# Patient Record
Sex: Female | Born: 1945 | Race: White | Hispanic: No | Smoking: Former smoker
Health system: Southern US, Community
[De-identification: ages and names within clinical notes are randomized; demographics above are authoritative.]

## PROBLEM LIST (undated history)

## (undated) DIAGNOSIS — R112 Nausea with vomiting, unspecified: Secondary | ICD-10-CM

## (undated) DIAGNOSIS — K760 Fatty (change of) liver, not elsewhere classified: Secondary | ICD-10-CM

## (undated) DIAGNOSIS — Z720 Tobacco use: Secondary | ICD-10-CM

## (undated) DIAGNOSIS — I2510 Atherosclerotic heart disease of native coronary artery without angina pectoris: Secondary | ICD-10-CM

## (undated) DIAGNOSIS — R748 Abnormal levels of other serum enzymes: Secondary | ICD-10-CM

## (undated) DIAGNOSIS — K219 Gastro-esophageal reflux disease without esophagitis: Secondary | ICD-10-CM

## (undated) DIAGNOSIS — M791 Myalgia, unspecified site: Secondary | ICD-10-CM

## (undated) DIAGNOSIS — R7303 Prediabetes: Secondary | ICD-10-CM

## (undated) DIAGNOSIS — Z9889 Other specified postprocedural states: Secondary | ICD-10-CM

## (undated) DIAGNOSIS — E785 Hyperlipidemia, unspecified: Secondary | ICD-10-CM

## (undated) DIAGNOSIS — M8580 Other specified disorders of bone density and structure, unspecified site: Secondary | ICD-10-CM

## (undated) DIAGNOSIS — K5790 Diverticulosis of intestine, part unspecified, without perforation or abscess without bleeding: Secondary | ICD-10-CM

## (undated) DIAGNOSIS — S42302A Unspecified fracture of shaft of humerus, left arm, initial encounter for closed fracture: Secondary | ICD-10-CM

## (undated) DIAGNOSIS — M7070 Other bursitis of hip, unspecified hip: Secondary | ICD-10-CM

## (undated) DIAGNOSIS — M1990 Unspecified osteoarthritis, unspecified site: Secondary | ICD-10-CM

## (undated) HISTORY — DX: Hyperlipidemia, unspecified: E78.5

## (undated) HISTORY — DX: Unspecified fracture of shaft of humerus, left arm, initial encounter for closed fracture: S42.302A

## (undated) HISTORY — PX: COLONOSCOPY W/ POLYPECTOMY: SHX1380

## (undated) HISTORY — PX: BREAST SURGERY: SHX581

## (undated) HISTORY — DX: Other bursitis of hip, unspecified hip: M70.70

## (undated) HISTORY — PX: ESOPHAGOGASTRODUODENOSCOPY (EGD) WITH ESOPHAGEAL DILATION: SHX5812

## (undated) HISTORY — DX: Prediabetes: R73.03

## (undated) HISTORY — DX: Myalgia, unspecified site: M79.10

---

## 1976-05-01 HISTORY — PX: AUGMENTATION MAMMAPLASTY: SUR837

## 1997-10-12 ENCOUNTER — Ambulatory Visit (HOSPITAL_COMMUNITY): Admission: RE | Admit: 1997-10-12 | Discharge: 1997-10-12 | Payer: Self-pay | Admitting: Obstetrics and Gynecology

## 1998-10-15 ENCOUNTER — Ambulatory Visit (HOSPITAL_COMMUNITY): Admission: RE | Admit: 1998-10-15 | Discharge: 1998-10-15 | Payer: Self-pay | Admitting: Obstetrics and Gynecology

## 1998-10-15 ENCOUNTER — Other Ambulatory Visit: Admission: RE | Admit: 1998-10-15 | Discharge: 1998-10-15 | Payer: Self-pay | Admitting: Obstetrics and Gynecology

## 1999-12-05 ENCOUNTER — Encounter: Admission: RE | Admit: 1999-12-05 | Discharge: 1999-12-05 | Payer: Self-pay | Admitting: Obstetrics and Gynecology

## 1999-12-05 ENCOUNTER — Encounter: Payer: Self-pay | Admitting: Obstetrics and Gynecology

## 1999-12-05 ENCOUNTER — Other Ambulatory Visit: Admission: RE | Admit: 1999-12-05 | Discharge: 1999-12-05 | Payer: Self-pay | Admitting: Obstetrics and Gynecology

## 2000-12-07 ENCOUNTER — Encounter: Payer: Self-pay | Admitting: Obstetrics and Gynecology

## 2000-12-07 ENCOUNTER — Ambulatory Visit (HOSPITAL_COMMUNITY): Admission: RE | Admit: 2000-12-07 | Discharge: 2000-12-07 | Payer: Self-pay | Admitting: Obstetrics and Gynecology

## 2000-12-07 ENCOUNTER — Other Ambulatory Visit: Admission: RE | Admit: 2000-12-07 | Discharge: 2000-12-07 | Payer: Self-pay | Admitting: Obstetrics and Gynecology

## 2003-09-25 ENCOUNTER — Ambulatory Visit (HOSPITAL_COMMUNITY): Admission: RE | Admit: 2003-09-25 | Discharge: 2003-09-25 | Payer: Self-pay | Admitting: Obstetrics and Gynecology

## 2003-09-25 ENCOUNTER — Other Ambulatory Visit: Admission: RE | Admit: 2003-09-25 | Discharge: 2003-09-25 | Payer: Self-pay | Admitting: Obstetrics and Gynecology

## 2004-08-16 ENCOUNTER — Ambulatory Visit: Payer: Self-pay | Admitting: Gastroenterology

## 2005-12-28 ENCOUNTER — Ambulatory Visit (HOSPITAL_COMMUNITY): Admission: RE | Admit: 2005-12-28 | Discharge: 2005-12-28 | Payer: Self-pay | Admitting: Internal Medicine

## 2007-06-12 ENCOUNTER — Ambulatory Visit (HOSPITAL_COMMUNITY): Admission: RE | Admit: 2007-06-12 | Discharge: 2007-06-12 | Payer: Self-pay | Admitting: Internal Medicine

## 2007-06-26 ENCOUNTER — Encounter: Admission: RE | Admit: 2007-06-26 | Discharge: 2007-06-26 | Payer: Self-pay | Admitting: Internal Medicine

## 2008-05-01 HISTORY — PX: UPPER GASTROINTESTINAL ENDOSCOPY: SHX188

## 2009-03-16 ENCOUNTER — Ambulatory Visit: Payer: Self-pay | Admitting: Unknown Physician Specialty

## 2010-05-22 ENCOUNTER — Encounter: Payer: Self-pay | Admitting: Internal Medicine

## 2010-05-27 ENCOUNTER — Ambulatory Visit: Payer: Self-pay | Admitting: Unknown Physician Specialty

## 2010-06-21 ENCOUNTER — Other Ambulatory Visit (HOSPITAL_COMMUNITY): Payer: Self-pay | Admitting: Internal Medicine

## 2010-06-21 DIAGNOSIS — Z1231 Encounter for screening mammogram for malignant neoplasm of breast: Secondary | ICD-10-CM

## 2010-07-18 ENCOUNTER — Ambulatory Visit (HOSPITAL_COMMUNITY): Payer: Self-pay

## 2010-07-18 ENCOUNTER — Ambulatory Visit: Payer: Self-pay | Admitting: Rheumatology

## 2010-07-20 ENCOUNTER — Ambulatory Visit (HOSPITAL_COMMUNITY)
Admission: RE | Admit: 2010-07-20 | Discharge: 2010-07-20 | Disposition: A | Discharge status: Home / Self Care | Payer: Medicare Other | Source: Ambulatory Visit | Attending: Internal Medicine | Admitting: Internal Medicine

## 2010-07-20 ENCOUNTER — Ambulatory Visit (HOSPITAL_COMMUNITY): Admission: RE | Admit: 2010-07-20 | Payer: Medicare Other | Source: Ambulatory Visit

## 2010-07-20 DIAGNOSIS — Z1231 Encounter for screening mammogram for malignant neoplasm of breast: Secondary | ICD-10-CM | POA: Insufficient documentation

## 2011-01-05 ENCOUNTER — Ambulatory Visit (INDEPENDENT_AMBULATORY_CARE_PROVIDER_SITE_OTHER): Payer: Medicare Other | Admitting: Internal Medicine

## 2011-01-05 ENCOUNTER — Encounter: Payer: Self-pay | Admitting: Internal Medicine

## 2011-01-05 VITALS — BP 127/79 | HR 84 | Temp 98.5°F | Resp 16 | Ht 65.0 in | Wt 182.0 lb

## 2011-01-05 DIAGNOSIS — T8549XA Other mechanical complication of breast prosthesis and implant, initial encounter: Secondary | ICD-10-CM

## 2011-01-05 DIAGNOSIS — E785 Hyperlipidemia, unspecified: Secondary | ICD-10-CM

## 2011-01-05 DIAGNOSIS — M79605 Pain in left leg: Secondary | ICD-10-CM

## 2011-01-05 DIAGNOSIS — M79609 Pain in unspecified limb: Secondary | ICD-10-CM

## 2011-01-05 DIAGNOSIS — T8543XA Leakage of breast prosthesis and implant, initial encounter: Secondary | ICD-10-CM

## 2011-01-05 NOTE — Progress Notes (Signed)
Subjective:    Patient ID: Taylor Simmons, female    DOB: 18-Sep-1945, 65 y.o.   MRN: [id number]  HPI Taylor Simmons is a 66 year old female who presents to establish care. Her only concern today is a history of rupture of her bilateral silicone breast implants. She reports that her last mammogram showed stable rupture of both of her implants. She questions whether she needs to have these implants removed. She denies any pain or discomfort in her chest. She was told in the past that she may need an MRI of her breast for further evaluation. Her silicone implants were placed in 1970, and the surgeon who performed this procedure is not available.  Taylor Simmons also notes a history of bilateral leg pain over her anterior thighs. She reports that this has been worked up extensively including by Dr. Marchia Bond at the pain clinic and Dr. Kathi Ludwig at rheumatology. She is currently taking Neurontin 100 mg at night with good control of her pain.  Outpatient Encounter Prescriptions as of 01/05/2011  Medication Sig Dispense Refill  . aspirin 81 MG tablet Take 81 mg by mouth daily.        . Methylcellulose, Laxative, (CITRUCEL PO) Take 4 capsules by mouth daily.        . Omega-3 Fatty Acids (FISH OIL) 1000 MG CAPS Take 1 capsule by mouth 2 (two) times daily at 10 AM and 5 PM.        . AMITIZA 24 MCG capsule Take 24 mcg by mouth 2 (two) times daily.       . clonazePAM (KLONOPIN) 1 MG tablet Take 1 mg by mouth at bedtime as needed.       . CRESTOR 20 MG tablet Take 20 mg by mouth daily.       Marland Kitchen DEXILANT 60 MG capsule Take 1 capsule by mouth daily.       . fluticasone (FLONASE) 50 MCG/ACT nasal spray Place 2 sprays into the nose daily.       Marland Kitchen gabapentin (NEURONTIN) 100 MG capsule Take 100 mg by mouth 3 (three) times daily.         Review of Systems  Constitutional: Negative for fever, chills, appetite change, fatigue and unexpected weight change.  HENT: Negative for ear pain, congestion, sore throat, trouble  swallowing, neck pain, voice change and sinus pressure.   Eyes: Negative for visual disturbance.  Respiratory: Negative for cough, shortness of breath, wheezing and stridor.   Cardiovascular: Negative for chest pain, palpitations and leg swelling.  Gastrointestinal: Negative for nausea, vomiting, abdominal pain, diarrhea, constipation, blood in stool, abdominal distention and anal bleeding.  Genitourinary: Negative for dysuria and flank pain.  Musculoskeletal: Positive for myalgias and arthralgias. Negative for gait problem.  Skin: Negative for color change and rash.  Neurological: Negative for dizziness and headaches.  Hematological: Negative for adenopathy. Does not bruise/bleed easily.  Psychiatric/Behavioral: Negative for suicidal ideas, sleep disturbance and dysphoric mood. The patient is not nervous/anxious.    BP 127/79  Pulse 84  Temp(Src) 98.5 F (36.9 C) (Oral)  Resp 16  Ht 5\' 5"  (1.651 m)  Wt 182 lb (82.555 kg)  BMI 30.29 kg/m2  SpO2 97%     Objective:   Physical Exam  Constitutional: She is oriented to person, place, and time. She appears well-developed and well-nourished. No distress.  HENT:  Head: Normocephalic and atraumatic.  Right Ear: External ear normal.  Left Ear: External ear normal.  Nose: Nose normal.  Mouth/Throat: Oropharynx is clear and  moist. No oropharyngeal exudate.  Eyes: Conjunctivae are normal. Pupils are equal, round, and reactive to light. Right eye exhibits no discharge. Left eye exhibits no discharge. No scleral icterus.  Neck: Normal range of motion. Neck supple. No tracheal deviation present. No thyromegaly present.  Cardiovascular: Normal rate, regular rhythm, normal heart sounds and intact distal pulses.  Exam reveals no gallop and no friction rub.   No murmur heard. Pulmonary/Chest: Effort normal and breath sounds normal. No respiratory distress. She has no wheezes. She has no rales. She exhibits no tenderness.  Abdominal: Soft. Bowel  sounds are normal. She exhibits no distension and no mass. There is no tenderness. There is no rebound and no guarding.  Musculoskeletal: Normal range of motion. She exhibits no edema and no tenderness.  Lymphadenopathy:    She has no cervical adenopathy.  Neurological: She is alert and oriented to person, place, and time. No cranial nerve deficit. She exhibits normal muscle tone. Coordination normal.  Skin: Skin is warm and dry. No rash noted. She is not diaphoretic. No erythema. No pallor.  Psychiatric: She has a normal mood and affect. Her behavior is normal. Judgment and thought content normal.          Assessment & Plan:  1. Breast implant rupture -  Patient with a history of silicone implants placed in the 1970s. Recent mammogram in 2012 showed stable bilateral rupture of these implants. Patient questions whether he should be removed. We will set her up with plastic surgery to get their opinion.  2. Leg pain - patient with chronic bilateral thigh pain. She reports extensive workup as to the cause of this and we will request previous records. She will continue Neurontin at night as this is working well for her.  3. Hyperlipidemia - patient with a history of hyperlipidemia currently taking Crestor. We will request recent records including lipid profile and LFTs.

## 2011-02-13 ENCOUNTER — Encounter: Payer: Self-pay | Admitting: Internal Medicine

## 2011-02-13 ENCOUNTER — Ambulatory Visit (INDEPENDENT_AMBULATORY_CARE_PROVIDER_SITE_OTHER): Payer: Medicare Other | Admitting: Internal Medicine

## 2011-02-13 VITALS — BP 134/78 | HR 65 | Temp 98.0°F | Resp 16 | Ht 65.0 in | Wt 181.2 lb

## 2011-02-13 DIAGNOSIS — Z23 Encounter for immunization: Secondary | ICD-10-CM

## 2011-02-13 DIAGNOSIS — E039 Hypothyroidism, unspecified: Secondary | ICD-10-CM

## 2011-02-13 DIAGNOSIS — E785 Hyperlipidemia, unspecified: Secondary | ICD-10-CM

## 2011-02-13 DIAGNOSIS — Z01818 Encounter for other preprocedural examination: Secondary | ICD-10-CM

## 2011-02-13 DIAGNOSIS — K5900 Constipation, unspecified: Secondary | ICD-10-CM | POA: Insufficient documentation

## 2011-02-13 LAB — CBC WITH DIFFERENTIAL/PLATELET
Basophils Absolute: 0 10*3/uL (ref 0.0–0.1)
Eosinophils Absolute: 0.1 10*3/uL (ref 0.0–0.7)
Eosinophils Relative: 1.2 % (ref 0.0–5.0)
HCT: 39.2 % (ref 36.0–46.0)
Lymphs Abs: 2.4 10*3/uL (ref 0.7–4.0)
MCHC: 33.1 g/dL (ref 30.0–36.0)
MCV: 97.4 fl (ref 78.0–100.0)
Monocytes Absolute: 0.5 10*3/uL (ref 0.1–1.0)
Neutrophils Relative %: 52.1 % (ref 43.0–77.0)
Platelets: 223 10*3/uL (ref 150.0–400.0)
RDW: 12.6 % (ref 11.5–14.6)

## 2011-02-13 LAB — COMPREHENSIVE METABOLIC PANEL
AST: 25 U/L (ref 0–37)
Alkaline Phosphatase: 62 U/L (ref 39–117)
Glucose, Bld: 109 mg/dL — ABNORMAL HIGH (ref 70–99)
Sodium: 143 mEq/L (ref 135–145)
Total Bilirubin: 0.7 mg/dL (ref 0.3–1.2)
Total Protein: 7 g/dL (ref 6.0–8.3)

## 2011-02-13 LAB — TSH: TSH: 1.04 u[IU]/mL (ref 0.35–5.50)

## 2011-02-13 NOTE — Progress Notes (Signed)
Subjective:    Patient ID: Taylor Simmons, female    DOB: 1945-11-01, 65 y.o.   MRN: [id number]  HPI Taylor Simmons is a 65 year old female who presents for followup. At her last visit she reported that her breast implants for leaking. We set her up for evaluation with plastic surgery at [facility]. She reports that she discussed removing her implants with her plastic surgeon. She is still not absolutely sure that she will have this procedure. She is debating both having the procedure and whether or not to replace the implants with saline versus silicone implants. She notes that she will need a preoperative evaluation prior to surgery. She was told that she will need to have lab work and EKG done prior to surgery.  Taylor Simmons also reports persistent constipation. She notes that she tried Amtiza as prescribed by Dr. Mechele Collin with minimal improvement. She notes that this constipation has been persistent since she was a teenager. She has tried several medications with no improvement. She has tried increasing her consumption of fiber and water without improvement. GI workup has been unrevealing to date. She notes that chronic constipation leads to abdominal distention and she feels that this limits her ability to lose weight.  Outpatient Encounter Prescriptions as of 02/13/2011  Medication Sig Dispense Refill  . AMITIZA 24 MCG capsule Take 24 mcg by mouth 2 (two) times daily.       . clonazePAM (KLONOPIN) 1 MG tablet Take 1 mg by mouth at bedtime as needed.       . CRESTOR 20 MG tablet Take 20 mg by mouth daily.       Marland Kitchen DEXILANT 60 MG capsule Take 1 capsule by mouth daily.       . fluticasone (FLONASE) 50 MCG/ACT nasal spray Place 2 sprays into the nose daily.       Marland Kitchen gabapentin (NEURONTIN) 100 MG capsule Take 100 mg by mouth 3 (three) times daily.         Review of Systems  Constitutional: Negative for fever, chills, diaphoresis and fatigue.  Gastrointestinal: Positive for constipation and abdominal  distention. Negative for nausea, abdominal pain, diarrhea, blood in stool and anal bleeding.  Musculoskeletal: Positive for myalgias and arthralgias.   BP 134/78  Pulse 65  Temp(Src) 98 F (36.7 C) (Oral)  Resp 16  Ht 5\' 5"  (1.651 m)  Wt 181 lb 4 oz (82.214 kg)  BMI 30.16 kg/m2  SpO2 95%     Objective:   Physical Exam  Constitutional: She is oriented to person, place, and time. She appears well-developed and well-nourished. No distress.  HENT:  Head: Normocephalic and atraumatic.  Right Ear: External ear normal.  Left Ear: External ear normal.  Nose: Nose normal.  Mouth/Throat: Oropharynx is clear and moist. No oropharyngeal exudate.  Eyes: Conjunctivae are normal. Pupils are equal, round, and reactive to light. Right eye exhibits no discharge. Left eye exhibits no discharge. No scleral icterus.  Neck: Normal range of motion. Neck supple. No tracheal deviation present. No thyromegaly present.  Cardiovascular: Normal rate, regular rhythm, normal heart sounds and intact distal pulses.  Exam reveals no gallop and no friction rub.   No murmur heard. Pulmonary/Chest: Effort normal and breath sounds normal. No respiratory distress. She has no wheezes. She has no rales. She exhibits no tenderness.  Musculoskeletal: Normal range of motion. She exhibits no edema and no tenderness.  Lymphadenopathy:    She has no cervical adenopathy.  Neurological: She is alert and oriented to person,  place, and time. No cranial nerve deficit. She exhibits normal muscle tone. Coordination normal.  Skin: Skin is warm and dry. No rash noted. She is not diaphoretic. No erythema. No pallor.  Psychiatric: She has a normal mood and affect. Her behavior is normal. Judgment and thought content normal.          Assessment & Plan:  1. Breast Implant Leakage / preoperative eval for implant removal -we discussed the pros and cons of implant removal today. Given that the risk of silicone leakage weighs on her mind  so heavily I suspect that she would benefit from removal of her implants. It seems that she would also benefit from replacement with saline implants given that the question of toxicity from silicone has been worrisome to her. However, she is likely to have a significant recovery period given that it has been so long since the implants were first placed and she likely has extensive scar tissue. I encouraged her to seek the advice of her surgeon, who will be able to better estimate her recovery time and provide guidance as to which type of implant to use. She will followup with her plastic surgeon. Based on the modified risk index she would be a low risk for perioperative cardiac events. We will perform EKG and lab work today as requested by her surgeon.  2. Constipation -  Constipation has been long-standing. She reports extensive GI workup which has been negative to date. We will request these reports. I encouraged her to continue with increased fluid intake and to continue using amtiza daily. TSH with labs today to ensure that hypothyroidism not leading to constipation.

## 2011-03-27 ENCOUNTER — Other Ambulatory Visit: Payer: Self-pay | Admitting: Internal Medicine

## 2011-03-28 ENCOUNTER — Telehealth: Payer: Self-pay | Admitting: *Deleted

## 2011-03-28 ENCOUNTER — Other Ambulatory Visit: Payer: Self-pay | Admitting: Internal Medicine

## 2011-03-28 NOTE — Telephone Encounter (Signed)
OK to refill one month supply

## 2011-03-28 NOTE — Telephone Encounter (Signed)
Pharm faxed RF request -  Klonopin 1 mg 1 qhs PRN. OK for RF?

## 2011-03-29 MED ORDER — CLONAZEPAM 1 MG PO TABS: 1.0000 mg | ORAL_TABLET | Freq: Every evening | ORAL | Status: DC | PRN

## 2011-03-29 NOTE — Telephone Encounter (Signed)
Called in.

## 2011-05-17 ENCOUNTER — Telehealth: Payer: Self-pay | Admitting: *Deleted

## 2011-05-17 ENCOUNTER — Encounter: Payer: Self-pay | Admitting: Internal Medicine

## 2011-05-17 ENCOUNTER — Ambulatory Visit (INDEPENDENT_AMBULATORY_CARE_PROVIDER_SITE_OTHER): Payer: Medicare Other | Admitting: Internal Medicine

## 2011-05-17 DIAGNOSIS — E785 Hyperlipidemia, unspecified: Secondary | ICD-10-CM

## 2011-05-17 DIAGNOSIS — F411 Generalized anxiety disorder: Secondary | ICD-10-CM

## 2011-05-17 DIAGNOSIS — F419 Anxiety disorder, unspecified: Secondary | ICD-10-CM

## 2011-05-17 DIAGNOSIS — K5900 Constipation, unspecified: Secondary | ICD-10-CM

## 2011-05-17 MED ORDER — LACTULOSE 20 GM/30ML PO SOLN: 30.0000 mL | ORAL | Status: DC | PRN

## 2011-05-17 MED ORDER — CLONAZEPAM 1 MG PO TABS: 1.0000 mg | ORAL_TABLET | Freq: Every evening | ORAL | Status: DC | PRN

## 2011-05-17 MED ORDER — ROSUVASTATIN CALCIUM 20 MG PO TABS: 20.0000 mg | ORAL_TABLET | Freq: Every day | ORAL | Status: DC

## 2011-05-17 NOTE — Progress Notes (Signed)
Subjective:    Patient ID: Taylor Simmons, female    DOB: 1945/10/05, 66 y.o.   MRN: [id number]  HPI Taylor Simmons is a 66 year old female who presents for followup.She notes that she has not yet made a decision about whether or not to remove her breast implants. She is debating both having the procedure and whether or not to replace the implants with saline versus silicone implants. She is planning to return to her plastic surgeon for additional discussion prior to making a decision.  Taylor Simmons also reports persistent constipation. She notes that she tried Amtiza as prescribed by Dr. Mechele Collin with minimal improvement. She notes that this constipation has been persistent since she was a teenager. She has tried several medications with no improvement. She has tried increasing her consumption of fiber and water without improvement. GI workup has been unrevealing to date. She notes that chronic constipation leads to abdominal distention and she feels that this limits her ability to lose weight. She is interested in trying another medication.   Review of Systems  Constitutional: Negative for fever, chills, appetite change, fatigue and unexpected weight change.  HENT: Negative for ear pain, congestion, sore throat, trouble swallowing, neck pain, voice change and sinus pressure.   Eyes: Negative for visual disturbance.  Respiratory: Negative for cough, shortness of breath, wheezing and stridor.   Cardiovascular: Negative for chest pain, palpitations and leg swelling.  Gastrointestinal: Positive for constipation and abdominal distention. Negative for nausea, vomiting, abdominal pain, diarrhea, blood in stool and anal bleeding.  Genitourinary: Negative for dysuria and flank pain.  Musculoskeletal: Negative for myalgias, arthralgias and gait problem.  Skin: Negative for color change and rash.  Neurological: Negative for dizziness and headaches.  Hematological: Negative for adenopathy. Does not  bruise/bleed easily.  Psychiatric/Behavioral: Negative for suicidal ideas, sleep disturbance and dysphoric mood. The patient is not nervous/anxious.    BP 158/68  Pulse 54  Temp(Src) 98.7 F (37.1 C) (Oral)  Ht 5\' 5"  (1.651 m)  Wt 183 lb (83.008 kg)  BMI 30.45 kg/m2  SpO2 98%     Objective:   Physical Exam  Constitutional: She is oriented to person, place, and time. She appears well-developed and well-nourished. No distress.  HENT:  Head: Normocephalic and atraumatic.  Right Ear: External ear normal.  Left Ear: External ear normal.  Nose: Nose normal.  Mouth/Throat: Oropharynx is clear and moist. No oropharyngeal exudate.  Eyes: Conjunctivae are normal. Pupils are equal, round, and reactive to light. Right eye exhibits no discharge. Left eye exhibits no discharge. No scleral icterus.  Neck: Normal range of motion. Neck supple. No tracheal deviation present. No thyromegaly present.  Cardiovascular: Normal rate, regular rhythm, normal heart sounds and intact distal pulses.  Exam reveals no gallop and no friction rub.   No murmur heard. Pulmonary/Chest: Effort normal and breath sounds normal. No respiratory distress. She has no wheezes. She has no rales. She exhibits no tenderness.  Abdominal: Soft. She exhibits distension. There is no tenderness.  Musculoskeletal: Normal range of motion. She exhibits no edema and no tenderness.  Lymphadenopathy:    She has no cervical adenopathy.  Neurological: She is alert and oriented to person, place, and time. No cranial nerve deficit. She exhibits normal muscle tone. Coordination normal.  Skin: Skin is warm and dry. No rash noted. She is not diaphoretic. No erythema. No pallor.  Psychiatric: She has a normal mood and affect. Her behavior is normal. Judgment and thought content normal.  Assessment & Plan:  1. Breast Implant Leakage / preoperative eval for implant removal -we again discussed the pros and cons of implant removal  today. She will followup with her plastic surgeon prior to making final decision.  2. Constipation -  Constipation has been long-standing. She reports extensive GI workup which has been negative to date.  I encouraged her to continue with increased fluid intake and to continue using amtiza daily. Will add lactulose to see if any improvement. I have also given pt samples of Align to try to see if any improvement. Follow up 6 months.

## 2011-05-17 NOTE — Telephone Encounter (Signed)
Called in klonopin

## 2011-07-14 ENCOUNTER — Encounter: Payer: Self-pay | Admitting: Internal Medicine

## 2011-07-14 ENCOUNTER — Ambulatory Visit (INDEPENDENT_AMBULATORY_CARE_PROVIDER_SITE_OTHER)
Admission: RE | Admit: 2011-07-14 | Discharge: 2011-07-14 | Disposition: A | Discharge status: Home / Self Care | Payer: Medicare Other | Source: Ambulatory Visit | Attending: Internal Medicine | Admitting: Internal Medicine

## 2011-07-14 ENCOUNTER — Ambulatory Visit (INDEPENDENT_AMBULATORY_CARE_PROVIDER_SITE_OTHER): Payer: Medicare Other | Admitting: Internal Medicine

## 2011-07-14 DIAGNOSIS — K5900 Constipation, unspecified: Secondary | ICD-10-CM

## 2011-07-14 DIAGNOSIS — R143 Flatulence: Secondary | ICD-10-CM

## 2011-07-14 DIAGNOSIS — R079 Chest pain, unspecified: Secondary | ICD-10-CM | POA: Insufficient documentation

## 2011-07-14 DIAGNOSIS — E785 Hyperlipidemia, unspecified: Secondary | ICD-10-CM

## 2011-07-14 DIAGNOSIS — R109 Unspecified abdominal pain: Secondary | ICD-10-CM

## 2011-07-14 DIAGNOSIS — R140 Abdominal distension (gaseous): Secondary | ICD-10-CM

## 2011-07-14 DIAGNOSIS — E039 Hypothyroidism, unspecified: Secondary | ICD-10-CM

## 2011-07-14 DIAGNOSIS — D510 Vitamin B12 deficiency anemia due to intrinsic factor deficiency: Secondary | ICD-10-CM

## 2011-07-14 DIAGNOSIS — J019 Acute sinusitis, unspecified: Secondary | ICD-10-CM | POA: Insufficient documentation

## 2011-07-14 LAB — COMPREHENSIVE METABOLIC PANEL
ALT: 46 U/L — ABNORMAL HIGH (ref 0–35)
Albumin: 4.3 g/dL (ref 3.5–5.2)
CO2: 28 mEq/L (ref 19–32)
Calcium: 9.2 mg/dL (ref 8.4–10.5)
Chloride: 104 mEq/L (ref 96–112)
GFR: 64.06 mL/min (ref >60.00)
Glucose, Bld: 89 mg/dL (ref 70–99)
Potassium: 4.1 mEq/L (ref 3.5–5.1)
Sodium: 141 mEq/L (ref 135–145)
Total Bilirubin: 0.4 mg/dL (ref 0.3–1.2)
Total Protein: 7.2 g/dL (ref 6.0–8.3)

## 2011-07-14 LAB — CBC WITH DIFFERENTIAL/PLATELET
Basophils Absolute: 0 10*3/uL (ref 0.0–0.1)
Eosinophils Absolute: 0.2 10*3/uL (ref 0.0–0.7)
Hemoglobin: 12.4 g/dL (ref 12.0–15.0)
Lymphocytes Relative: 33.3 % (ref 12.0–46.0)
MCHC: 33.6 g/dL (ref 30.0–36.0)
Monocytes Relative: 11.1 % (ref 3.0–12.0)
Neutro Abs: 3.5 10*3/uL (ref 1.4–7.7)
Platelets: 226 10*3/uL (ref 150.0–400.0)
RDW: 13.3 % (ref 11.5–14.6)

## 2011-07-14 LAB — VITAMIN B12: Vitamin B-12: 452 pg/mL (ref 211–911)

## 2011-07-14 LAB — TSH: TSH: 1.01 u[IU]/mL (ref 0.35–5.50)

## 2011-07-14 MED ORDER — AMOXICILLIN-POT CLAVULANATE 875-125 MG PO TABS: 1.0000 | ORAL_TABLET | Freq: Two times a day (BID) | ORAL | Status: AC

## 2011-07-14 NOTE — Assessment & Plan Note (Signed)
Unclear etiology. Symptoms are not consistent with myocardial ischemia and EKG is normal. Pulmonary exam is normal, however patient is former smoker and has some risk of malignancy. Will get chest x-ray for further evaluation. His chest x-ray is normal and symptoms persist, may need to get CT of the chest for further evaluation.

## 2011-07-14 NOTE — Assessment & Plan Note (Signed)
As above, will get CT of the abdomen for further evaluation.

## 2011-07-14 NOTE — Assessment & Plan Note (Signed)
Patient is very concerned about abdominal bloating and distention with mild pain. Exam is most consistent with fatty tissue over the superficial abdomen. However, given that this is a change from her baseline and she reports significant distention compared to previous, will get CT for further evaluation. Alternative considerations would include ovarian pathology. Follow up 1 week.

## 2011-07-14 NOTE — Assessment & Plan Note (Signed)
Symptoms consistent with sinusitis. Will treat with Augmentin. Patient will call if symptoms are not improving over the next 72 hours.

## 2011-07-14 NOTE — Progress Notes (Signed)
Subjective:    Patient ID: Taylor Simmons, female    DOB: 05-22-1945, 66 y.o.   MRN: [id number]  HPI 66 year old female presents for acute visit complaining of feeling "jittery "and having left-sided intermittent sharp chest pain over the last several weeks. These symptoms do not occur at exertion. They're intermittent and occur at rest. He resolve within seconds without any intervention. She denies any persistent chest pain or heaviness, diaphoresis, nausea, or shortness of breath.  She is also concerned today about a three-week history of nasal congestion. She reports that the drainage from her nose has purulent. She also reports occasional cough which is nonproductive. She denies shortness of breath, fever, chills. She has been using over-the-counter cough and cold preparations with no improvement.  She is also concerned today about several month history of gradually progressive abdominal distention. She notes that she has been exercising vigorously and watching her diet an effort to improve her abdominal girth however her abdomen continues to increase in size. She has been reading on the Internet and is concerned about the potential of fibroid tumor. She reports mild pain with the distention of her abdomen. She notes that she has never had a hysterectomy and ovaries are still present. She denies any vaginal discharge or bleeding. She denies any change in her bowel habits, blood in her stool, or black stool. She denies nausea or vomiting.  Outpatient Encounter Prescriptions as of 07/14/2011  Medication Sig Dispense Refill  . AMITIZA 24 MCG capsule Take 24 mcg by mouth 2 (two) times daily.       . clonazePAM (KLONOPIN) 1 MG tablet Take 1 tablet (1 mg total) by mouth at bedtime as needed.  30 tablet  3  . DEXILANT 60 MG capsule Take 1 capsule by mouth daily.       . fluticasone (FLONASE) 50 MCG/ACT nasal spray USE 2 SPRAYS EACH NOSTRIL ONCE A DAY  16 g  6  . Lactulose 20 GM/30ML SOLN Take 30 mLs  (20 g total) by mouth as needed (Take 30ml every 2hr until BM, up to 3 doses twice weekly).  500 mL  6  . rosuvastatin (CRESTOR) 20 MG tablet Take 1 tablet (20 mg total) by mouth daily.  30 tablet  6  . amoxicillin-clavulanate (AUGMENTIN) 875-125 MG per tablet Take 1 tablet by mouth 2 (two) times daily.  20 tablet  0  . gabapentin (NEURONTIN) 100 MG capsule         Review of Systems  Constitutional: Negative for fever, chills, appetite change, fatigue and unexpected weight change.  HENT: Positive for congestion and sinus pressure. Negative for ear pain, sore throat, trouble swallowing, neck pain and voice change.   Eyes: Negative for visual disturbance.  Respiratory: Negative for cough, shortness of breath, wheezing and stridor.   Cardiovascular: Positive for chest pain. Negative for palpitations and leg swelling.  Gastrointestinal: Positive for abdominal pain and abdominal distention. Negative for nausea, vomiting, diarrhea, constipation, blood in stool and anal bleeding.  Genitourinary: Negative for dysuria and flank pain.  Musculoskeletal: Negative for myalgias, arthralgias and gait problem.  Skin: Negative for color change and rash.  Neurological: Negative for dizziness and headaches.  Hematological: Negative for adenopathy. Does not bruise/bleed easily.  Psychiatric/Behavioral: Negative for suicidal ideas, sleep disturbance and dysphoric mood. The patient is not nervous/anxious.    BP 138/68  Pulse 90  Temp(Src) 98.4 F (36.9 C) (Oral)  Ht 5\' 5"  (1.651 m)  Wt 189 lb (85.73 kg)  BMI 31.45  kg/m2  SpO2 97%     Objective:   Physical Exam  Constitutional: She is oriented to person, place, and time. She appears well-developed and well-nourished. No distress.  HENT:  Head: Normocephalic and atraumatic.  Right Ear: External ear normal.  Left Ear: External ear normal.  Nose: Nose normal.  Mouth/Throat: Oropharynx is clear and moist. No oropharyngeal exudate.  Eyes: Conjunctivae are  normal. Pupils are equal, round, and reactive to light. Right eye exhibits no discharge. Left eye exhibits no discharge. No scleral icterus.  Neck: Normal range of motion. Neck supple. No tracheal deviation present. No thyromegaly present.  Cardiovascular: Normal rate, regular rhythm, normal heart sounds and intact distal pulses.  Exam reveals no gallop and no friction rub.   No murmur heard. Pulmonary/Chest: Effort normal and breath sounds normal. No respiratory distress. She has no wheezes. She has no rales. She exhibits no tenderness.  Abdominal: Soft. Bowel sounds are normal. She exhibits distension (mild). She exhibits no mass. There is no tenderness. There is no rebound and no guarding.  Musculoskeletal: Normal range of motion. She exhibits no edema and no tenderness.  Lymphadenopathy:    She has no cervical adenopathy.  Neurological: She is alert and oriented to person, place, and time. No cranial nerve deficit. She exhibits normal muscle tone. Coordination normal.  Skin: Skin is warm and dry. No rash noted. She is not diaphoretic. No erythema. No pallor.  Psychiatric: She has a normal mood and affect. Her behavior is normal. Judgment and thought content normal.          Assessment & Plan:

## 2011-07-20 ENCOUNTER — Ambulatory Visit: Payer: Self-pay | Admitting: Internal Medicine

## 2011-07-21 ENCOUNTER — Telehealth: Payer: Self-pay | Admitting: Internal Medicine

## 2011-07-21 NOTE — Telephone Encounter (Signed)
Patient notified. She says that she sees Dr. Markham Jordan, but it has been over a year ago.  She is asking that the appt not be scheduled between April 17th to the 20th.

## 2011-07-21 NOTE — Telephone Encounter (Signed)
CT abdomen showed diverticulosis, which is a chronic finding.  The radiologist noted "fullness" in the small bowel, likely from how the bowel was moving at the time of the study.  Has the patient seen GI physician recently? Given her symptoms of bloating, I would like to schedule her with her GI physician to have them review the study and perhaps perform upper endoscopy.

## 2011-07-21 NOTE — Telephone Encounter (Signed)
Patient is scheduled to see Fransico Setters at [facility] who is the PA to dr. Markham Jordan appointment is for 08/03/11 at 8:30.  Patient is aware of this appointment.

## 2011-07-24 ENCOUNTER — Other Ambulatory Visit: Payer: Self-pay | Admitting: Internal Medicine

## 2011-07-27 ENCOUNTER — Telehealth: Payer: Self-pay | Admitting: Internal Medicine

## 2011-07-27 NOTE — Telephone Encounter (Signed)
Spoke w/pt - She has NO fever, SOB, wheezing. C/o cough, sinus congestion & clear drainage. Reports some small amounts of blood streaked mucus but overall is feeling better in the last 24 hours. She is somewhat upset about getting a call back late in the day and that we had denied her request for RF on the antibiotic. I explained our triage nurse line for future concerns like this as she would have gotten a call back much quicker (w/in an hour or so). She did call the office this afternoon but said she had "already left one message and did not want to leave another one" w/Triage CAN for a call back. Pt states that she knows her body and felt it would be un-necessary to have come into the office when she was "just there". I advised claritin & mucinex (which she already has at home and is using) and to call and speak w/on call nurses w/change in symptoms. She understands that w/absence of fever and other signs of infection that it does not sound like she needs the refill of this antibiotic. Also explained that in most cases Dr Dan Humphreys requires another OV for re-eval which she said again seems un-necessary.   Pt advised of sat clinic and I offered her an apt with MD on Monday. She advised me that she would express her concerns/dissatisfaction at f/u OV on Tuesday. The thanked me for my time and did not seem upset when hanging up.

## 2011-07-27 NOTE — Telephone Encounter (Signed)
[phone] Pt walked in wanted to get a rx for amox tr-k clv   875-125mg  tab 1 tabelt 2  Times daily Pt is not feeling any better from last visit

## 2011-08-01 ENCOUNTER — Encounter: Payer: Self-pay | Admitting: Internal Medicine

## 2011-08-01 ENCOUNTER — Ambulatory Visit (INDEPENDENT_AMBULATORY_CARE_PROVIDER_SITE_OTHER): Payer: Medicare Other | Admitting: Internal Medicine

## 2011-08-01 VITALS — BP 128/82 | HR 93 | Temp 98.5°F | Wt 194.0 lb

## 2011-08-01 DIAGNOSIS — R140 Abdominal distension (gaseous): Secondary | ICD-10-CM

## 2011-08-01 DIAGNOSIS — R141 Gas pain: Secondary | ICD-10-CM

## 2011-08-01 DIAGNOSIS — J40 Bronchitis, not specified as acute or chronic: Secondary | ICD-10-CM

## 2011-08-01 MED ORDER — LEVOFLOXACIN 750 MG PO TABS: 750.0000 mg | ORAL_TABLET | Freq: Every day | ORAL | Status: AC

## 2011-08-01 NOTE — Assessment & Plan Note (Signed)
Symptoms today are most consistent with acute bronchitis, given prolonged expiratory phase and wheezing noted in the right lung. Recommended treating with antibiotics and steroid taper. Patient refuses to use steroids because of concern about weight gain. Will treat with antibiotics alone. She reports she has tolerated Levaquin in the past. We discussed the potential risk of tendinitis with this medication. Will start Levaquin and planned for 7 day course. She will call if there any problems at this medication.

## 2011-08-01 NOTE — Assessment & Plan Note (Signed)
CT of the abdomen showed possible thickening of the small bowel. Patient has followup with GI physician on 08/03/2011, for review of her scan and possible endoscopy.

## 2011-08-01 NOTE — Progress Notes (Signed)
Subjective:    Patient ID: Taylor Simmons, female    DOB: [birth year], 66 y.o.   MRN: [id number]  HPI 66 year old female presents for followup. She is upset today and disappointed in the care that she has received in our office. She notes that she came into the office last week on Thursday and requested a refill on her antibiotic prescription for Augmentin. Our staff discussed with her that we do not automatically refill antibiotics but would prefer to see the patient first to assess. She reports that she does not agree with this policy. She notes that other physicians in the past have agreed to call in antibiotic refills if she had been seen in the last 6 months. She was given the option of coming to clinic for a visit on Monday, as our office was closed on Friday for the holiday, but refuse this. She notes that she felt that her symptoms of nasal congestion and cough are not improving and she needed a second course of antibiotics. She also notes that she is planning to go to the beach within the next week and is trying to be healthier for this. She felt that an antibiotic would help her achieve that. She denies any recent fever or chills. She reports continued nasal congestion which improves with the use of Claritin. She also notes cough productive of purulent sputum. She notes that she has had bronchitis several times in the past and is a former smoker.  Outpatient Encounter Prescriptions as of 08/01/2011  Medication Sig Dispense Refill  . AMITIZA 24 MCG capsule Take 24 mcg by mouth 2 (two) times daily.       . clonazePAM (KLONOPIN) 1 MG tablet Take 1 tablet (1 mg total) by mouth at bedtime as needed.  30 tablet  3  . DEXILANT 60 MG capsule Take 1 capsule by mouth daily.       . fluticasone (FLONASE) 50 MCG/ACT nasal spray USE 2 SPRAYS EACH NOSTRIL ONCE A DAY  16 g  6  . gabapentin (NEURONTIN) 100 MG capsule       . Lactulose 20 GM/30ML SOLN Take 30 mLs (20 g total) by mouth as needed (Take 30ml  every 2hr until BM, up to 3 doses twice weekly).  500 mL  6  . rosuvastatin (CRESTOR) 20 MG tablet Take 1 tablet (20 mg total) by mouth daily.  30 tablet  6  . levofloxacin (LEVAQUIN) 750 MG tablet Take 1 tablet (750 mg total) by mouth daily.  7 tablet  0    Review of Systems  Constitutional: Negative for fever, chills, appetite change, fatigue and unexpected weight change.  HENT: Positive for congestion and postnasal drip. Negative for ear pain, sore throat, trouble swallowing, neck pain, voice change and sinus pressure.   Eyes: Negative for visual disturbance.  Respiratory: Positive for cough. Negative for shortness of breath, wheezing and stridor.   Cardiovascular: Negative for chest pain, palpitations and leg swelling.  Gastrointestinal: Negative for nausea, vomiting, abdominal pain, diarrhea, constipation, blood in stool, abdominal distention and anal bleeding.  Genitourinary: Negative for dysuria and flank pain.  Musculoskeletal: Negative for myalgias, arthralgias and gait problem.  Skin: Negative for color change and rash.  Neurological: Negative for dizziness and headaches.  Hematological: Negative for adenopathy. Does not bruise/bleed easily.  Psychiatric/Behavioral: Negative for suicidal ideas, sleep disturbance and dysphoric mood. The patient is not nervous/anxious.    BP 128/82  Pulse 93  Temp(Src) 98.5 F (36.9 C) (Oral)  Wt 194  lb (87.998 kg)  SpO2 95%     Objective:   Physical Exam  Constitutional: She is oriented to person, place, and time. She appears well-developed and well-nourished. No distress.  HENT:  Head: Normocephalic and atraumatic.  Right Ear: External ear normal.  Left Ear: External ear normal.  Nose: Nose normal.  Mouth/Throat: Oropharynx is clear and moist. No oropharyngeal exudate.  Eyes: Conjunctivae are normal. Pupils are equal, round, and reactive to light. Right eye exhibits no discharge. Left eye exhibits no discharge. No scleral icterus.    Neck: Normal range of motion. Neck supple. No tracheal deviation present. No thyromegaly present.  Cardiovascular: Normal rate, regular rhythm, normal heart sounds and intact distal pulses.  Exam reveals no gallop and no friction rub.   No murmur heard. Pulmonary/Chest: Effort normal. No accessory muscle usage. Not tachypneic. No respiratory distress. She has decreased breath sounds in the right middle field and the right lower field. She has wheezes in the right upper field, the right middle field and the right lower field. She has rhonchi in the right lower field. She has no rales. She exhibits no tenderness.  Musculoskeletal: Normal range of motion. She exhibits no edema and no tenderness.  Lymphadenopathy:    She has no cervical adenopathy.  Neurological: She is alert and oriented to person, place, and time. No cranial nerve deficit. She exhibits normal muscle tone. Coordination normal.  Skin: Skin is warm and dry. No rash noted. She is not diaphoretic. No erythema. No pallor.  Psychiatric: Her behavior is normal. Judgment and thought content normal. Her affect is angry.          Assessment & Plan:

## 2011-08-11 ENCOUNTER — Ambulatory Visit: Payer: Self-pay | Admitting: Unknown Physician Specialty

## 2011-08-24 ENCOUNTER — Encounter: Payer: Self-pay | Admitting: Internal Medicine

## 2011-09-05 ENCOUNTER — Other Ambulatory Visit (HOSPITAL_COMMUNITY): Payer: Self-pay | Admitting: Family Medicine

## 2011-09-05 DIAGNOSIS — Z1231 Encounter for screening mammogram for malignant neoplasm of breast: Secondary | ICD-10-CM

## 2011-10-05 ENCOUNTER — Ambulatory Visit (HOSPITAL_COMMUNITY)
Admission: RE | Admit: 2011-10-05 | Discharge: 2011-10-05 | Disposition: A | Discharge status: Home / Self Care | Payer: Medicare Other | Source: Ambulatory Visit | Attending: Family Medicine | Admitting: Family Medicine

## 2011-10-05 DIAGNOSIS — Z1231 Encounter for screening mammogram for malignant neoplasm of breast: Secondary | ICD-10-CM

## 2011-11-15 ENCOUNTER — Ambulatory Visit: Payer: Medicare Other | Admitting: Internal Medicine

## 2011-11-23 ENCOUNTER — Other Ambulatory Visit: Payer: Self-pay | Admitting: Unknown Physician Specialty

## 2011-11-23 ENCOUNTER — Other Ambulatory Visit: Payer: Self-pay | Admitting: *Deleted

## 2011-11-23 DIAGNOSIS — D128 Benign neoplasm of rectum: Secondary | ICD-10-CM

## 2011-11-23 DIAGNOSIS — D126 Benign neoplasm of colon, unspecified: Secondary | ICD-10-CM

## 2011-11-23 DIAGNOSIS — D129 Benign neoplasm of anus and anal canal: Secondary | ICD-10-CM

## 2011-11-30 ENCOUNTER — Ambulatory Visit
Admission: RE | Admit: 2011-11-30 | Discharge: 2011-11-30 | Disposition: A | Discharge status: Home / Self Care | Payer: Medicare Other | Source: Ambulatory Visit | Attending: *Deleted | Admitting: *Deleted

## 2011-11-30 DIAGNOSIS — D128 Benign neoplasm of rectum: Secondary | ICD-10-CM

## 2011-11-30 DIAGNOSIS — D126 Benign neoplasm of colon, unspecified: Secondary | ICD-10-CM

## 2011-11-30 DIAGNOSIS — D129 Benign neoplasm of anus and anal canal: Secondary | ICD-10-CM

## 2011-12-05 ENCOUNTER — Other Ambulatory Visit: Payer: Self-pay | Admitting: Unknown Physician Specialty

## 2011-12-05 DIAGNOSIS — D126 Benign neoplasm of colon, unspecified: Secondary | ICD-10-CM

## 2011-12-05 DIAGNOSIS — D128 Benign neoplasm of rectum: Secondary | ICD-10-CM

## 2012-05-03 ENCOUNTER — Telehealth: Payer: Self-pay | Admitting: Internal Medicine

## 2012-05-03 NOTE — Telephone Encounter (Signed)
Fluticasone prop 50 mcg spray  Use 2 sprays each nostril once a day  Qty. 16.0 gm

## 2012-05-06 MED ORDER — FLUTICASONE PROPIONATE 50 MCG/ACT NA SUSP: 2.0000 | Freq: Every day | NASAL | Status: DC

## 2012-05-06 NOTE — Telephone Encounter (Signed)
Filled script for Fluticasone prop 50 mcg spray

## 2012-05-22 DIAGNOSIS — M25559 Pain in unspecified hip: Secondary | ICD-10-CM | POA: Insufficient documentation

## 2012-06-15 ENCOUNTER — Other Ambulatory Visit: Payer: Self-pay

## 2012-07-13 ENCOUNTER — Ambulatory Visit: Payer: Self-pay | Admitting: Physical Medicine and Rehabilitation

## 2012-07-13 ENCOUNTER — Ambulatory Visit: Payer: Self-pay | Admitting: Orthopedic Surgery

## 2012-12-04 ENCOUNTER — Other Ambulatory Visit: Payer: Self-pay

## 2012-12-10 ENCOUNTER — Other Ambulatory Visit: Payer: Self-pay | Admitting: Infectious Diseases

## 2012-12-10 DIAGNOSIS — Z1231 Encounter for screening mammogram for malignant neoplasm of breast: Secondary | ICD-10-CM

## 2012-12-13 ENCOUNTER — Other Ambulatory Visit: Payer: Self-pay | Admitting: Infectious Diseases

## 2012-12-13 ENCOUNTER — Ambulatory Visit (HOSPITAL_COMMUNITY)
Admission: RE | Admit: 2012-12-13 | Discharge: 2012-12-13 | Disposition: A | Discharge status: Home / Self Care | Payer: Medicare Other | Source: Ambulatory Visit | Attending: Infectious Diseases | Admitting: Infectious Diseases

## 2012-12-13 DIAGNOSIS — Z1231 Encounter for screening mammogram for malignant neoplasm of breast: Secondary | ICD-10-CM | POA: Insufficient documentation

## 2013-03-06 ENCOUNTER — Other Ambulatory Visit: Payer: Self-pay

## 2013-05-05 ENCOUNTER — Ambulatory Visit: Payer: Self-pay

## 2013-05-28 DIAGNOSIS — M76899 Other specified enthesopathies of unspecified lower limb, excluding foot: Secondary | ICD-10-CM | POA: Insufficient documentation

## 2013-06-01 ENCOUNTER — Ambulatory Visit: Payer: Self-pay

## 2013-08-05 ENCOUNTER — Ambulatory Visit: Payer: Self-pay | Admitting: Physical Medicine and Rehabilitation

## 2014-07-31 ENCOUNTER — Other Ambulatory Visit
Admit: 2014-07-31 | Disposition: A | Discharge status: Home / Self Care | Payer: Self-pay | Attending: Physician Assistant | Admitting: Physician Assistant

## 2014-07-31 ENCOUNTER — Ambulatory Visit
Admit: 2014-07-31 | Disposition: A | Discharge status: Home / Self Care | Payer: Self-pay | Attending: Physician Assistant | Admitting: Physician Assistant

## 2014-07-31 LAB — D-DIMER(ARMC): D-Dimer: 946 ng/ml

## 2014-08-17 ENCOUNTER — Ambulatory Visit
Admit: 2014-08-17 | Disposition: A | Discharge status: Home / Self Care | Payer: Self-pay | Attending: Unknown Physician Specialty | Admitting: Unknown Physician Specialty

## 2014-08-17 ENCOUNTER — Inpatient Hospital Stay
Admit: 2014-08-17 | Disposition: A | Discharge status: Home / Self Care | Payer: Self-pay | Attending: Internal Medicine | Admitting: Internal Medicine

## 2014-08-17 LAB — CBC
HCT: 40.5 % (ref 35.0–47.0)
HGB: 13.6 g/dL (ref 12.0–16.0)
MCH: 30.4 pg (ref 26.0–34.0)
MCHC: 33.5 g/dL (ref 32.0–36.0)
MCV: 91 fL (ref 80–100)
Platelet: 243 10*3/uL (ref 150–440)
RBC: 4.47 10*6/uL (ref 3.80–5.20)
RDW: 13 % (ref 11.5–14.5)
WBC: 16.2 10*3/uL — AB (ref 3.6–11.0)

## 2014-08-17 LAB — HM COLONOSCOPY

## 2014-08-17 LAB — URINALYSIS, COMPLETE
BILIRUBIN, UR: NEGATIVE
Glucose,UR: NEGATIVE mg/dL (ref 0–75)
Ketone: NEGATIVE
Leukocyte Esterase: NEGATIVE
Nitrite: NEGATIVE
PH: 6 (ref 4.5–8.0)
Protein: NEGATIVE
SPECIFIC GRAVITY: 1.021 (ref 1.003–1.030)

## 2014-08-17 LAB — BASIC METABOLIC PANEL
ANION GAP: 9 (ref 7–16)
BUN: 13 mg/dL
CO2: 28 mmol/L
CREATININE: 0.89 mg/dL
Calcium, Total: 8.9 mg/dL
Chloride: 102 mmol/L
EGFR (Non-African Amer.): >60
Glucose: 180 mg/dL — ABNORMAL HIGH
POTASSIUM: 2.9 mmol/L — AB
Sodium: 139 mmol/L

## 2014-08-17 LAB — MAGNESIUM: MAGNESIUM: 1.9 mg/dL

## 2014-08-17 LAB — LACTIC ACID, PLASMA: LACTIC ACID, VENOUS: 2.2 mmol/L — AB

## 2014-08-18 LAB — CBC WITH DIFFERENTIAL/PLATELET
Basophil #: 0 10*3/uL (ref 0.0–0.1)
Basophil %: 0.2 %
Eosinophil #: 0 10*3/uL (ref 0.0–0.7)
Eosinophil %: 0.5 %
HCT: 34.8 % — ABNORMAL LOW (ref 35.0–47.0)
HGB: 11.5 g/dL — ABNORMAL LOW (ref 12.0–16.0)
LYMPHS ABS: 2.2 10*3/uL (ref 1.0–3.6)
Lymphocyte %: 20.7 %
MCH: 30.5 pg (ref 26.0–34.0)
MCHC: 33 g/dL (ref 32.0–36.0)
MCV: 93 fL (ref 80–100)
MONOS PCT: 5.6 %
Monocyte #: 0.6 x10 3/mm (ref 0.2–0.9)
Neutrophil #: 7.7 10*3/uL — ABNORMAL HIGH (ref 1.4–6.5)
Neutrophil %: 73 %
Platelet: 197 10*3/uL (ref 150–440)
RBC: 3.76 10*6/uL — ABNORMAL LOW (ref 3.80–5.20)
RDW: 12.9 % (ref 11.5–14.5)
WBC: 10.6 10*3/uL (ref 3.6–11.0)

## 2014-08-18 LAB — BASIC METABOLIC PANEL
BUN: 11 mg/dL
Calcium, Total: 7.8 mg/dL — ABNORMAL LOW
Chloride: 112 mmol/L — ABNORMAL HIGH
Co2: 30 mmol/L
Creatinine: 0.92 mg/dL
EGFR (African American): >60
EGFR (Non-African Amer.): >60
Glucose: 139 mg/dL — ABNORMAL HIGH
POTASSIUM: 3 mmol/L — AB
SODIUM: 141 mmol/L

## 2014-08-18 LAB — MAGNESIUM: Magnesium: 2.1 mg/dL

## 2014-08-22 LAB — CULTURE, BLOOD (SINGLE)

## 2014-08-24 LAB — SURGICAL PATHOLOGY

## 2014-08-26 ENCOUNTER — Encounter: Payer: Self-pay | Admitting: *Deleted

## 2014-08-30 NOTE — Consult Note (Signed)
Pt had some regurgitation of small amt of stomach juice during the colonoscopy (which followed her EGD) her chest exam showed some crackles which got better after Duodneb treatment and was clear.  A CXR showed no infiltrate.  She was told to contact me if she had any respiratory symptoms and her relative called me that the patient was short of breath and pale and clammy.  I advised her to come to our clinic immediately and she had a saturation of 84% so was taken to ER and was evaluated and admitted for possible aspiration pneumonia.   Electronic Signatures: Manya Silvas (MD)  (Signed on 18-Apr-16 18:02)  Authored  Last Updated: 18-Apr-16 18:02 by Manya Silvas (MD)

## 2014-08-30 NOTE — Discharge Summary (Addendum)
PATIENT NAME:  Taylor Simmons, Taylor Simmons MR#:  [id number] DATE OF BIRTH:  Feb 27, 1946  DATE OF ADMISSION:  08/17/2014 DATE OF DISCHARGE:  08/18/2014  DISCHARGE DIAGNOSES: 1.  Sepsis secondary to aspiration pneumonia, resolved. 2.  Acute hypoxic respiratory failure due to aspiration pneumonia, resolved. 3.  Hyperlipidemia.  4.  Anxiety.  5.  Hypokalemia.  DISCHARGE MEDICATIONS: Crestor 20 mg daily, melatonin 5 mg daily, Mucinex 1200 mg every 12 hours, fluticasone nasal spray 50 mcg 2 sprays once a day, lorazepam 0.5 mg p.o. as needed for anxiety, magnesium oxide 250 mg p.o. daily, Tylenol 650 mg p.o. every 8 hours, diphenhydramine 25 mg daily at bedtime as needed for chest congestion, Dexilant 60 mg delayed release in the morning, Zoloft 50 mg at bedtime, Linzess 1 capsule daily, hydrochlorothiazide 25 mg p.o. daily, temazepam 50 mg p.o. at bedtime,  Colace 100 mg p.o. b.i.d., Levaquin 750 mg p.o. every day for 6 days.   CONSULTATIONS: GI consult, Manya Silvas, MD.  HOSPITAL COURSE: A 69 year old female patient admitted because of shortness of breath. The patient had an EGD and colonoscopy yesterday by Dr. Pat Patrick. The patient noted to have shortness of breath, cough, and brought in by family because of trouble breathing and cough. The patient also was hypoxic with oxygen saturation 84% on room air. So, the patient was sent into ER for further evaluation. The patient's chest x-ray showed no pneumonia.White count 16.2. The patient admitted for sepsis secondary to possible aspiration pneumonia, so she received Zosyn, Levaquin,  oxygen. The patient also received IV fluids because of sepsis. The patient's blood cultures have been negative. White count normalized to 10.6 yesterday and chest x-ray repeated, which did not show any acute changes. The patient remained afebrile and hemodynamically stable, and we discharged her home with Levaquin. The patient's oxygen saturations were 98% on room air at the time of  discharge. The patient advised to follow up with her primary doctor and also Dr. Tiffany Kocher. Primary doctor is Dr. Cheral Marker. Ola Spurr, MD.  Hypokalemia. The patient's potassium was low on admission, which we replaced. The patient's potassium was 2.9 on admission, replaced that as well.   We continued other medications for her hyperlipidemia, anxiety, GERD.   DISCHARGE PHYSICAL EXAMINATION: VITAL SIGNS: Temperature 98.4 Fahrenheit, heart rate 82, blood pressure 128/76, saturations 98% on room air.  CARDIOVASCULAR: S1, S2 regular. LUNGS: Clear to auscultation. No wheeze. No rales.  ABDOMEN: Soft, nontender, nondistended. Bowel sounds present.  NEUROLOGIC: Oriented to time, place, person.  TIME SPENT: More than 30 minutes.    ____________________________ Epifanio Lesches, MD sk:TM D: 08/19/2014 22:30:36 ET T: 08/19/2014 22:54:28 ET JOB#: [id number]  cc: Epifanio Lesches, MD, <Dictator> Cheral Marker. Ola Spurr, MD Manya Silvas, MD Epifanio Lesches MD ELECTRONICALLY SIGNED 09/01/2014 21:18

## 2014-08-30 NOTE — H&P (Signed)
PATIENT NAME:  Taylor Simmons, Taylor Simmons MR#:  [id number] DATE OF BIRTH:  05-Jan-1946  DATE OF ADMISSION:  08/17/2014  PRIMARY CARE PHYSICIAN:  Adrian Prows, MD  PRIMARY GASTROENTEROLOGIST:  Gaylyn Cheers, MD  EMERGENCY ROOM PHYSICIAN:  Loura Pardon, MD   CHIEF COMPLAINT: Shortness of breath and cough.   HISTORY OF PRESENT ILLNESS: The patient is a 69 year old Caucasian female with history of Barrett's esophagus, hiatal hernia, and constipation.  Was seen by Dr. Vira Agar and had EGD and colonoscopy done today.  Following the procedure, she was discharged home, but the patient was having shortness of breath and cough after the procedure.  As she was having shortness of breath and cough, family members have called Dr. Vira Agar and Dr. Vira Agar has suggested the patient  come to the ED to rule out aspiration pneumonia. Dr. Vira Agar is concerned about possible aspiration during the procedure. In the ED, her white count is elevated, lactic acid is elevated. Cultures were obtained in the ED, and the patient is started on broad-spectrum IV antibiotics for possible aspiration. Bedside swallow evaluation was fine.  The patient is hungry.   PAST MEDICAL HISTORY: Barrett's esophagus, borderline diabetes mellitus, constipation, hiatal hernia, acid reflux.   PAST SURGICAL HISTORY:   Hip surgery.  EGD and colonoscopy done today on April 17.  EGD reveals a Barrett's esophagus and some gastritis. Colonoscopy with small polyps, status post  polypectomy.   ALLERGIES: HYDROCODONE.   PSYCHOSOCIAL HISTORY: Lives at home with husband. Quit smoking several years ago.    Social drinking. Denies any illicit drug use.   FAMILY HISTORY: Hypertension runs in her family.   REVIEW OF SYSTEMS:  CONSTITUTIONAL: Denies any fever. Complaining of weakness.  EYES: Denies double vision or blurry vision.  EARS, NOSE, AND THROAT: Denies epistaxis, discharge.  RESPIRATORY: Has cough and  shortness of breath. Denies any COPD.   CARDIOVASCULAR: No chest pain or palpitations.  GASTROINTESTINAL: Denies nausea, vomiting, diarrhea, abdominal pain, hematemesis.  GENITOURINARY: No dysuria, hematuria.  GYN: No veginal bleeding or discharge ENDOCRINE:  Denies polyuria, nocturia, or thyroid problems.  HEMATOLOGIC AND  LYMPHATIC: No anemia, easy bruising, or bleeding.  INTEGUMENTARY: No acne, rash, or lesions.  MUSCULOSKELETAL: No joint pain in the neck and back. Denies gout.  NEUROLOGIC: Denies vertigo, ataxia.  PSYCHIATRIC: No ADD or OCD.   HOME MEDICATIONS: Clonazepam 0.5 mg p.o. 1 tablet once daily as needed for anxiety, Crestor 20 mg p.o. once a day at bedtime, Dexilant 60 mg p.o. once daily in the morning, diphenhydramine 25 mg p.o. at bedtime as needed for chest condition, Flonase 2 sprays nasally once a day as needed for rhinitis, hydrochlorothiazide 25 mg once daily in the a.m., magnesium oxide 250 mg p.o. once daily, melatonin 5 mg once a day at bedtime, Mucinex 1200 mg 1 tablet p.o. every 12 hours as needed for chest congestion, Zoloft 50 mg once daily at bedtime, temazepam 15 mg p.o. once daily at bedtime, Tylenol 650 mg every 8 hours as needed for pain.   PHYSICAL EXAMINATION: VITAL SIGNS: Temperature is 100.6, pulse 119, respirations 18, blood pressure 113/54, pulse oximetry 96% on room air.  GENERAL APPEARANCE: Not in acute distress. Moderately built and obese.  HEENT: Normocephalic, atraumatic. Pupils are equally reacting to light and accommodation. No scleral icterus. No conjunctival injection. No sinus tenderness. No postnasal drip. Moist mucous membranes.  NECK: Supple. No JVD. No thyromegaly. Range of motion is intact.  LUNGS: Decreased breath sounds, moderate air entry, no wheezing, no rhonchi.  CARDIAC: S1, S2 regular rate and rhythm. No murmurs.  GASTROINTESTINAL: Soft. Bowel sounds are positive in all 4 quadrants, obese, nontender, nondistended. No masses.  NEUROLOGIC: Awake, alert, oriented x3. Cranial  nerves II through XII are grossly intact. Motor and sensory are intact. Reflexes are 2+.  EXTREMITIES: No edema. No cyanosis. No clubbing.  SKIN: Warm to touch. Normal turgor. No rashes. No lesions.   MUSCULOSKELETAL: No joint effusion, tenderness, or edema.  PSYCHIATRIC: Normal mood and affect.   LABORATORY AND IMAGING STUDIES: Lactic acid is elevated at 2.2. WBC 16.2, the rest of the CBC is normal.  Urinalysis yellow in color, cloudy in appearance; nitrates and leukocyte esterase are negative.  Glucose 180.   BUN and creatinine are normal. Sodium 139, potassium 2.9, chloride 102, CO2 of 28. Anion gap 9. GFR greater than 60, magnesium 1.9. Calcium is 8.9.   Portable chest x-ray:  No active disease. Chest x-ray PA and lateral views at 3:47 has revealed no active cardiopulmonary disease.   ASSESSMENT AND PLAN: A 69 year old Caucasian female with shortness of breath and cough after two days procedure esophagogastroduodenoscopy and colonoscopy.  This is a concern for aspiration during procedure.  Presenting to the Emergency Department and laboratories have revealed leukocytosis, elevated lactic acid, and the patient is found to be febrile and tachycardic.  1. Sepsis.   Meets criteria at the time of admission with a leukocytosis, elevated lactic acid, tachycardia and fever, probably aspiration pneumonia.  We will admit her to tele for telemetry monitoring. Blood cultures and sputum cultures are ordered. The patient will be on IV antibiotics of Zosyn, Levaquin and vancomycin. We will repeat chest x-ray in the morning.  2. Hypokalemia. We will replace potassium through IV fluids as well as IV potassium supplementation. We will check magnesium.  3. History of anxiety. Resume her home medication.  4. Hyperlipidemia. Continue statin.  5. Diabetes mellitus. Will monitor sugars and if they are elevated, we will put her on sliding scale insulin.  6. Constipation.   We will provide her laxatives.  7. History of  hiatal hernia and Barrett's esophagus. Management per gastroenterology. The patient will be on proton pump inhibitor.   CODE STATUS:   She is full code.   POWER OR ATTORNEY:  Daughter is the medical power of attorney. Plan of care discussed with the patient. She verbalized understanding of the plan.   TOTAL TIME SPENT: Fifty minutes.     ____________________________ Nicholes Mango, MD ag:tr D: 08/17/2014 19:27:00 ET T: 08/17/2014 19:54:28 ET JOB#: [id number]  cc: Nicholes Mango, MD, <Dictator> Nicholes Mango MD ELECTRONICALLY SIGNED 08/22/2014 17:51

## 2015-03-03 DIAGNOSIS — R739 Hyperglycemia, unspecified: Secondary | ICD-10-CM | POA: Insufficient documentation

## 2015-03-03 DIAGNOSIS — F5104 Psychophysiologic insomnia: Secondary | ICD-10-CM | POA: Insufficient documentation

## 2015-12-23 ENCOUNTER — Ambulatory Visit: Payer: Medicare Other | Attending: Specialist

## 2015-12-23 DIAGNOSIS — G4733 Obstructive sleep apnea (adult) (pediatric): Secondary | ICD-10-CM | POA: Insufficient documentation

## 2015-12-23 DIAGNOSIS — G4761 Periodic limb movement disorder: Secondary | ICD-10-CM | POA: Diagnosis not present

## 2015-12-23 DIAGNOSIS — R0683 Snoring: Secondary | ICD-10-CM | POA: Diagnosis present

## 2016-02-08 ENCOUNTER — Ambulatory Visit: Payer: BC Managed Care – PPO | Attending: Specialist

## 2016-02-29 ENCOUNTER — Ambulatory Visit: Payer: Medicare Other | Attending: Specialist

## 2016-02-29 DIAGNOSIS — G4733 Obstructive sleep apnea (adult) (pediatric): Secondary | ICD-10-CM | POA: Insufficient documentation

## 2016-02-29 DIAGNOSIS — G4761 Periodic limb movement disorder: Secondary | ICD-10-CM | POA: Insufficient documentation

## 2016-06-04 ENCOUNTER — Emergency Department: Payer: Medicare Other

## 2016-06-04 ENCOUNTER — Encounter: Payer: Self-pay | Admitting: Radiology

## 2016-06-04 ENCOUNTER — Encounter
Admission: EM | Disposition: A | Discharge status: Home / Self Care | Payer: Self-pay | Source: Home / Self Care | Attending: Surgery

## 2016-06-04 ENCOUNTER — Emergency Department: Payer: Medicare Other | Admitting: Anesthesiology

## 2016-06-04 ENCOUNTER — Inpatient Hospital Stay
Admission: EM | Admit: 2016-06-04 | Discharge: 2016-06-07 | DRG: 340 | Disposition: A | Discharge status: Home / Self Care | Payer: Medicare Other | Attending: Surgery | Admitting: Surgery

## 2016-06-04 DIAGNOSIS — Z87891 Personal history of nicotine dependence: Secondary | ICD-10-CM | POA: Diagnosis not present

## 2016-06-04 DIAGNOSIS — R9431 Abnormal electrocardiogram [ECG] [EKG]: Secondary | ICD-10-CM

## 2016-06-04 DIAGNOSIS — I4581 Long QT syndrome: Secondary | ICD-10-CM | POA: Diagnosis present

## 2016-06-04 DIAGNOSIS — K353 Acute appendicitis with localized peritonitis, without perforation or gangrene: Secondary | ICD-10-CM

## 2016-06-04 DIAGNOSIS — E785 Hyperlipidemia, unspecified: Secondary | ICD-10-CM | POA: Diagnosis present

## 2016-06-04 DIAGNOSIS — K3580 Unspecified acute appendicitis: Secondary | ICD-10-CM | POA: Diagnosis present

## 2016-06-04 DIAGNOSIS — E876 Hypokalemia: Secondary | ICD-10-CM | POA: Diagnosis present

## 2016-06-04 DIAGNOSIS — K381 Appendicular concretions: Secondary | ICD-10-CM | POA: Diagnosis present

## 2016-06-04 DIAGNOSIS — R109 Unspecified abdominal pain: Secondary | ICD-10-CM | POA: Diagnosis present

## 2016-06-04 HISTORY — PX: LAPAROSCOPIC APPENDECTOMY: SHX408

## 2016-06-04 LAB — COMPREHENSIVE METABOLIC PANEL
ALBUMIN: 4.8 g/dL (ref 3.5–5.0)
ALK PHOS: 98 U/L (ref 38–126)
ALT: 30 U/L (ref 14–54)
AST: 33 U/L (ref 15–41)
Anion gap: 11 (ref 5–15)
BUN: 10 mg/dL (ref 6–20)
CALCIUM: 8.8 mg/dL — AB (ref 8.9–10.3)
CO2: 29 mmol/L (ref 22–32)
CREATININE: 0.82 mg/dL (ref 0.44–1.00)
Chloride: 99 mmol/L — ABNORMAL LOW (ref 101–111)
GFR calc Af Amer: >60 mL/min (ref >60)
GFR calc non Af Amer: >60 mL/min (ref >60)
GLUCOSE: 152 mg/dL — AB (ref 65–99)
Potassium: 3.1 mmol/L — ABNORMAL LOW (ref 3.5–5.1)
SODIUM: 139 mmol/L (ref 135–145)
Total Bilirubin: 0.9 mg/dL (ref 0.3–1.2)
Total Protein: 7.7 g/dL (ref 6.5–8.1)

## 2016-06-04 LAB — CBC
HCT: 40 % (ref 35.0–47.0)
Hemoglobin: 13.8 g/dL (ref 12.0–16.0)
MCH: 31.8 pg (ref 26.0–34.0)
MCHC: 34.5 g/dL (ref 32.0–36.0)
MCV: 92.1 fL (ref 80.0–100.0)
Platelets: 260 10*3/uL (ref 150–440)
RBC: 4.34 MIL/uL (ref 3.80–5.20)
RDW: 12.9 % (ref 11.5–14.5)
WBC: 18.1 10*3/uL — ABNORMAL HIGH (ref 3.6–11.0)

## 2016-06-04 LAB — URINALYSIS, COMPLETE (UACMP) WITH MICROSCOPIC
Bacteria, UA: NONE SEEN
Bilirubin Urine: NEGATIVE
Glucose, UA: NEGATIVE mg/dL
Ketones, ur: NEGATIVE mg/dL
Nitrite: NEGATIVE
Protein, ur: NEGATIVE mg/dL
SPECIFIC GRAVITY, URINE: 1.021 (ref 1.005–1.030)
pH: 6 (ref 5.0–8.0)

## 2016-06-04 LAB — LIPASE, BLOOD: Lipase: 23 U/L (ref 11–51)

## 2016-06-04 SURGERY — APPENDECTOMY, LAPAROSCOPIC
Anesthesia: General

## 2016-06-04 MED ORDER — MORPHINE SULFATE (PF) 4 MG/ML IV SOLN
4.0000 mg | Freq: Once | INTRAVENOUS | Status: AC
  Administered 2016-06-04: 4 mg via INTRAVENOUS
  Filled 2016-06-04: qty 1

## 2016-06-04 MED ORDER — PIPERACILLIN-TAZOBACTAM 3.375 G IVPB 30 MIN: 3.3750 g | Freq: Three times a day (TID) | INTRAVENOUS | Status: DC

## 2016-06-04 MED ORDER — PANTOPRAZOLE SODIUM 40 MG PO TBEC
40.0000 mg | DELAYED_RELEASE_TABLET | Freq: Every day | ORAL | Status: DC
  Administered 2016-06-04 – 2016-06-07 (×4): 40 mg via ORAL
  Filled 2016-06-04 (×4): qty 1

## 2016-06-04 MED ORDER — MIDAZOLAM HCL 2 MG/2ML IJ SOLN
INTRAMUSCULAR | Status: AC
  Filled 2016-06-04: qty 2

## 2016-06-04 MED ORDER — DEXAMETHASONE SODIUM PHOSPHATE 10 MG/ML IJ SOLN
INTRAMUSCULAR | Status: DC | PRN
  Administered 2016-06-04: 10 mg via INTRAVENOUS

## 2016-06-04 MED ORDER — PHENYLEPHRINE HCL 10 MG/ML IJ SOLN
INTRAMUSCULAR | Status: DC | PRN
Start: 2016-06-04 — End: 2016-06-04
  Administered 2016-06-04: 100 ug via INTRAVENOUS

## 2016-06-04 MED ORDER — BUPIVACAINE-EPINEPHRINE (PF) 0.25% -1:200000 IJ SOLN
INTRAMUSCULAR | Status: AC
  Filled 2016-06-04: qty 30

## 2016-06-04 MED ORDER — FENTANYL CITRATE (PF) 100 MCG/2ML IJ SOLN
INTRAMUSCULAR | Status: AC
  Filled 2016-06-04: qty 2

## 2016-06-04 MED ORDER — PIPERACILLIN-TAZOBACTAM 3.375 G IVPB 30 MIN
3.3750 g | Freq: Once | INTRAVENOUS | Status: AC
  Administered 2016-06-04: 3.375 g via INTRAVENOUS
  Filled 2016-06-04: qty 50

## 2016-06-04 MED ORDER — MIDAZOLAM HCL 2 MG/2ML IJ SOLN
INTRAMUSCULAR | Status: DC | PRN
  Administered 2016-06-04: 2 mg via INTRAVENOUS

## 2016-06-04 MED ORDER — ORLISTAT 120 MG PO CAPS: 120.0000 mg | ORAL_CAPSULE | Freq: Three times a day (TID) | ORAL | Status: DC

## 2016-06-04 MED ORDER — SODIUM CHLORIDE 0.9 % IJ SOLN
INTRAMUSCULAR | Status: AC
  Filled 2016-06-04: qty 10

## 2016-06-04 MED ORDER — PROMETHAZINE HCL 25 MG/ML IJ SOLN: 6.2500 mg | INTRAMUSCULAR | Status: DC | PRN

## 2016-06-04 MED ORDER — IOPAMIDOL (ISOVUE-300) INJECTION 61%
100.0000 mL | Freq: Once | INTRAVENOUS | Status: AC | PRN
  Administered 2016-06-04: 100 mL via INTRAVENOUS

## 2016-06-04 MED ORDER — PROPOFOL 10 MG/ML IV BOLUS
INTRAVENOUS | Status: AC
  Filled 2016-06-04: qty 20

## 2016-06-04 MED ORDER — SODIUM CHLORIDE 0.9 % IV BOLUS (SEPSIS)
1000.0000 mL | Freq: Once | INTRAVENOUS | Status: AC
  Administered 2016-06-04: 1000 mL via INTRAVENOUS

## 2016-06-04 MED ORDER — OXYCODONE HCL 5 MG/5ML PO SOLN: 5.0000 mg | Freq: Once | ORAL | Status: DC | PRN

## 2016-06-04 MED ORDER — PROPOFOL 10 MG/ML IV BOLUS
INTRAVENOUS | Status: DC | PRN
  Administered 2016-06-04: 150 mg via INTRAVENOUS
  Administered 2016-06-04: 40 mg via INTRAVENOUS

## 2016-06-04 MED ORDER — MEPERIDINE HCL 25 MG/ML IJ SOLN: 6.2500 mg | INTRAMUSCULAR | Status: DC | PRN

## 2016-06-04 MED ORDER — DEXTROSE-NACL 5-0.9 % IV SOLN
INTRAVENOUS | Status: DC
  Administered 2016-06-04 (×2): via INTRAVENOUS

## 2016-06-04 MED ORDER — ROCURONIUM BROMIDE 100 MG/10ML IV SOLN
INTRAVENOUS | Status: DC | PRN
  Administered 2016-06-04: 30 mg via INTRAVENOUS
  Administered 2016-06-04: 20 mg via INTRAVENOUS

## 2016-06-04 MED ORDER — BUPIVACAINE-EPINEPHRINE (PF) 0.25% -1:200000 IJ SOLN
INTRAMUSCULAR | Status: DC | PRN
  Administered 2016-06-04: 30 mL

## 2016-06-04 MED ORDER — MORPHINE SULFATE (PF) 4 MG/ML IV SOLN
2.0000 mg | INTRAVENOUS | Status: DC | PRN
  Administered 2016-06-04 – 2016-06-05 (×5): 2 mg via INTRAVENOUS
  Filled 2016-06-04 (×5): qty 1

## 2016-06-04 MED ORDER — SUCCINYLCHOLINE CHLORIDE 200 MG/10ML IV SOSY
PREFILLED_SYRINGE | INTRAVENOUS | Status: AC
  Filled 2016-06-04: qty 10

## 2016-06-04 MED ORDER — LACTATED RINGERS IV SOLN
INTRAVENOUS | Status: DC | PRN
  Administered 2016-06-04: 10:00:00 via INTRAVENOUS

## 2016-06-04 MED ORDER — OXYCODONE HCL 5 MG PO TABS: 5.0000 mg | ORAL_TABLET | Freq: Once | ORAL | Status: DC | PRN

## 2016-06-04 MED ORDER — SODIUM CHLORIDE 0.9 % IV SOLN
INTRAVENOUS | Status: DC | PRN
  Administered 2016-06-04: 09:00:00 via INTRAVENOUS

## 2016-06-04 MED ORDER — SERTRALINE HCL 50 MG PO TABS
50.0000 mg | ORAL_TABLET | Freq: Every evening | ORAL | Status: DC
  Administered 2016-06-04 – 2016-06-06 (×3): 50 mg via ORAL
  Filled 2016-06-04 (×3): qty 1

## 2016-06-04 MED ORDER — MAGNESIUM OXIDE 400 (241.3 MG) MG PO TABS
400.0000 mg | ORAL_TABLET | Freq: Every day | ORAL | Status: DC
  Administered 2016-06-04 – 2016-06-07 (×4): 400 mg via ORAL
  Filled 2016-06-04 (×6): qty 1

## 2016-06-04 MED ORDER — DEXAMETHASONE SODIUM PHOSPHATE 10 MG/ML IJ SOLN
INTRAMUSCULAR | Status: AC
  Filled 2016-06-04: qty 1

## 2016-06-04 MED ORDER — ONDANSETRON HCL 4 MG/2ML IJ SOLN
INTRAMUSCULAR | Status: AC
  Filled 2016-06-04: qty 2

## 2016-06-04 MED ORDER — FLUTICASONE PROPIONATE 50 MCG/ACT NA SUSP
2.0000 | NASAL | Status: DC | PRN
  Filled 2016-06-04: qty 16

## 2016-06-04 MED ORDER — CLONAZEPAM 0.5 MG PO TABS: 0.5000 mg | ORAL_TABLET | Freq: Every day | ORAL | Status: DC | PRN

## 2016-06-04 MED ORDER — LINACLOTIDE 290 MCG PO CAPS
290.0000 ug | ORAL_CAPSULE | Freq: Every day | ORAL | Status: DC
  Administered 2016-06-04 – 2016-06-05 (×2): 290 ug via ORAL
  Filled 2016-06-04 (×4): qty 1

## 2016-06-04 MED ORDER — ATORVASTATIN CALCIUM 20 MG PO TABS
20.0000 mg | ORAL_TABLET | Freq: Every evening | ORAL | Status: DC
  Administered 2016-06-04 – 2016-06-06 (×3): 20 mg via ORAL
  Filled 2016-06-04 (×3): qty 1

## 2016-06-04 MED ORDER — ONDANSETRON HCL 4 MG/2ML IJ SOLN
INTRAMUSCULAR | Status: DC | PRN
  Administered 2016-06-04: 4 mg via INTRAVENOUS

## 2016-06-04 MED ORDER — ALUM & MAG HYDROXIDE-SIMETH 200-200-20 MG/5ML PO SUSP
30.0000 mL | ORAL | Status: DC | PRN
  Administered 2016-06-04 – 2016-06-07 (×5): 30 mL via ORAL
  Filled 2016-06-04 (×5): qty 30

## 2016-06-04 MED ORDER — ONDANSETRON HCL 4 MG/2ML IJ SOLN
4.0000 mg | INTRAMUSCULAR | Status: AC
  Administered 2016-06-04: 4 mg via INTRAVENOUS
  Filled 2016-06-04: qty 2

## 2016-06-04 MED ORDER — IOPAMIDOL (ISOVUE-300) INJECTION 61%
30.0000 mL | Freq: Once | INTRAVENOUS | Status: AC
  Administered 2016-06-04: 30 mL via ORAL

## 2016-06-04 MED ORDER — ONDANSETRON HCL 4 MG/2ML IJ SOLN
4.0000 mg | Freq: Once | INTRAMUSCULAR | Status: AC
  Administered 2016-06-04: 4 mg via INTRAVENOUS
  Filled 2016-06-04: qty 2

## 2016-06-04 MED ORDER — PIPERACILLIN-TAZOBACTAM 3.375 G IVPB
3.3750 g | Freq: Three times a day (TID) | INTRAVENOUS | Status: DC
  Administered 2016-06-04 – 2016-06-06 (×6): 3.375 g via INTRAVENOUS
  Filled 2016-06-04 (×6): qty 50

## 2016-06-04 MED ORDER — ACETAMINOPHEN 10 MG/ML IV SOLN
INTRAVENOUS | Status: AC
  Filled 2016-06-04: qty 100

## 2016-06-04 MED ORDER — TRAMADOL HCL 50 MG PO TABS
50.0000 mg | ORAL_TABLET | Freq: Four times a day (QID) | ORAL | Status: DC | PRN
  Administered 2016-06-05: 50 mg via ORAL
  Filled 2016-06-04: qty 1

## 2016-06-04 MED ORDER — HYDROCHLOROTHIAZIDE 25 MG PO TABS
25.0000 mg | ORAL_TABLET | Freq: Every day | ORAL | Status: DC
  Administered 2016-06-04 – 2016-06-07 (×4): 25 mg via ORAL
  Filled 2016-06-04 (×4): qty 1

## 2016-06-04 MED ORDER — FENTANYL CITRATE (PF) 100 MCG/2ML IJ SOLN
25.0000 ug | INTRAMUSCULAR | Status: DC | PRN
  Administered 2016-06-04: 50 ug via INTRAVENOUS

## 2016-06-04 MED ORDER — LIDOCAINE HCL (PF) 2 % IJ SOLN
INTRAMUSCULAR | Status: AC
  Filled 2016-06-04: qty 2

## 2016-06-04 MED ORDER — SUCCINYLCHOLINE CHLORIDE 20 MG/ML IJ SOLN
INTRAMUSCULAR | Status: DC | PRN
  Administered 2016-06-04: 100 mg via INTRAVENOUS

## 2016-06-04 MED ORDER — TRAZODONE HCL 50 MG PO TABS
50.0000 mg | ORAL_TABLET | Freq: Every evening | ORAL | Status: DC | PRN
  Administered 2016-06-04 – 2016-06-05 (×2): 100 mg via ORAL
  Administered 2016-06-06: 50 mg via ORAL
  Filled 2016-06-04: qty 1
  Filled 2016-06-04 (×2): qty 2

## 2016-06-04 MED ORDER — FENTANYL CITRATE (PF) 100 MCG/2ML IJ SOLN
INTRAMUSCULAR | Status: DC | PRN
  Administered 2016-06-04 (×2): 50 ug via INTRAVENOUS

## 2016-06-04 MED ORDER — ONDANSETRON 4 MG PO TBDP: 4.0000 mg | ORAL_TABLET | Freq: Once | ORAL | Status: DC | PRN

## 2016-06-04 MED ORDER — HEPARIN SODIUM (PORCINE) 5000 UNIT/ML IJ SOLN
5000.0000 [IU] | Freq: Three times a day (TID) | INTRAMUSCULAR | Status: DC
  Administered 2016-06-04 – 2016-06-07 (×9): 5000 [IU] via SUBCUTANEOUS
  Filled 2016-06-04 (×9): qty 1

## 2016-06-04 MED ORDER — GLYCOPYRROLATE 0.2 MG/ML IJ SOLN
INTRAMUSCULAR | Status: AC
  Filled 2016-06-04: qty 1

## 2016-06-04 MED ORDER — LIDOCAINE HCL (CARDIAC) 20 MG/ML IV SOLN
INTRAVENOUS | Status: DC | PRN
  Administered 2016-06-04: 100 mg via INTRAVENOUS

## 2016-06-04 MED ORDER — ACETAMINOPHEN 10 MG/ML IV SOLN
INTRAVENOUS | Status: DC | PRN
  Administered 2016-06-04: 1000 mg via INTRAVENOUS

## 2016-06-04 SURGICAL SUPPLY — 43 items
ADHESIVE MASTISOL STRL (MISCELLANEOUS) ×3 IMPLANT
APPLIER CLIP ROT 10 11.4 M/L (STAPLE) ×3
BLADE SURG SZ11 CARB STEEL (BLADE) ×3 IMPLANT
CANISTER SUCT 3000ML (MISCELLANEOUS) ×3 IMPLANT
CATH TRAY 16F METER LATEX (MISCELLANEOUS) ×3 IMPLANT
CHLORAPREP W/TINT 26ML (MISCELLANEOUS) ×3 IMPLANT
CLIP APPLIE ROT 10 11.4 M/L (STAPLE) ×1 IMPLANT
CLOSURE WOUND 1/2 X4 (GAUZE/BANDAGES/DRESSINGS) ×1
CUTTER FLEX LINEAR 45M (STAPLE) ×3 IMPLANT
DEVICE TROCAR PUNCTURE CLOSURE (ENDOMECHANICALS) ×3 IMPLANT
ELECT REM PT RETURN 9FT ADLT (ELECTROSURGICAL) ×3
ELECTRODE REM PT RTRN 9FT ADLT (ELECTROSURGICAL) ×1 IMPLANT
ENDOPOUCH RETRIEVER 10 (MISCELLANEOUS) ×3 IMPLANT
GAUZE SPONGE NON-WVN 2X2 STRL (MISCELLANEOUS) ×3 IMPLANT
GLOVE BIO SURGEON STRL SZ8 (GLOVE) ×15 IMPLANT
GOWN STRL REUS W/ TWL LRG LVL3 (GOWN DISPOSABLE) ×2 IMPLANT
GOWN STRL REUS W/TWL LRG LVL3 (GOWN DISPOSABLE) ×4
IRRIGATION STRYKERFLOW (MISCELLANEOUS) ×1 IMPLANT
IRRIGATOR STRYKERFLOW (MISCELLANEOUS) ×3
JACKSON PRATT 10 (INSTRUMENTS) ×3 IMPLANT
KIT RM TURNOVER STRD PROC AR (KITS) ×3 IMPLANT
LABEL OR SOLS (LABEL) ×3 IMPLANT
NDL SAFETY 22GX1.5 (NEEDLE) ×3 IMPLANT
NEEDLE VERESS 14GA 120MM (NEEDLE) ×3 IMPLANT
NS IRRIG 500ML POUR BTL (IV SOLUTION) ×3 IMPLANT
PACK LAP CHOLECYSTECTOMY (MISCELLANEOUS) ×3 IMPLANT
RELOAD 45 VASCULAR/THIN (ENDOMECHANICALS) ×3 IMPLANT
RELOAD STAPLE TA45 3.5 REG BLU (ENDOMECHANICALS) ×3 IMPLANT
SCISSORS METZENBAUM CVD 33 (INSTRUMENTS) IMPLANT
SLEEVE ENDOPATH XCEL 5M (ENDOMECHANICALS) ×3 IMPLANT
SOL .9 NS 3000ML IRR  AL (IV SOLUTION) ×2
SOL .9 NS 3000ML IRR UROMATIC (IV SOLUTION) ×1 IMPLANT
SPONGE LAP 18X18 5 PK (GAUZE/BANDAGES/DRESSINGS) ×3 IMPLANT
SPONGE VERSALON 2X2 STRL (MISCELLANEOUS) ×6
STRIP CLOSURE SKIN 1/2X4 (GAUZE/BANDAGES/DRESSINGS) ×2 IMPLANT
SUT MNCRL 4-0 (SUTURE) ×2
SUT MNCRL 4-0 27XMFL (SUTURE) ×1
SUT VICRYL 0 TIES 12 18 (SUTURE) ×3 IMPLANT
SUTURE MNCRL 4-0 27XMF (SUTURE) ×1 IMPLANT
TROCAR XCEL 12X100 BLDLESS (ENDOMECHANICALS) ×3 IMPLANT
TROCAR XCEL NON-BLD 5MMX100MML (ENDOMECHANICALS) ×3 IMPLANT
TUBING INSUFFLATOR HI FLOW (MISCELLANEOUS) ×3 IMPLANT
TUBING SCD EXTENSION 9995 GYN (TUBING) IMPLANT

## 2016-06-04 NOTE — Anesthesia Postprocedure Evaluation (Signed)
Anesthesia Post Note  Patient: Yasuri Pomerenke  Procedure(s) Performed: Procedure(s) (LRB): APPENDECTOMY LAPAROSCOPIC (N/A)  Patient location during evaluation: PACU Anesthesia Type: General Level of consciousness: awake and alert and oriented Pain management: pain level controlled Vital Signs Assessment: post-procedure vital signs reviewed and stable Respiratory status: spontaneous breathing, nonlabored ventilation and respiratory function stable Cardiovascular status: blood pressure returned to baseline and stable Postop Assessment: no signs of nausea or vomiting Anesthetic complications: no     Last Vitals:  Vitals:   06/04/16 1043 06/04/16 1055  BP: 128/60 (!) 118/58  Pulse: 86 79  Resp: 17 15  Temp:      Last Pain:  Vitals:   06/04/16 1055  TempSrc:   PainSc: Asleep                 Vianney Kopecky

## 2016-06-04 NOTE — Transfer of Care (Signed)
Immediate Anesthesia Transfer of Care Note  Patient: Taylor Simmons  Procedure(s) Performed: Procedure(s): APPENDECTOMY LAPAROSCOPIC (N/A)  Patient Location: PACU  Anesthesia Type:General  Level of Consciousness: sedated  Airway & Oxygen Therapy: Patient Spontanous Breathing and Patient connected to face mask oxygen  Post-op Assessment: Report given to RN and Post -op Vital signs reviewed and stable  Post vital signs: Reviewed and stable  Last Vitals:  Vitals:   06/04/16 0800 06/04/16 1025  BP: 129/62 139/61  Pulse: 80 80  Resp: 16 19  Temp:  [id number]    Complications: No apparent anesthesia complications

## 2016-06-04 NOTE — Anesthesia Post-op Follow-up Note (Cosign Needed)
Anesthesia QCDR form completed.        

## 2016-06-04 NOTE — Anesthesia Procedure Notes (Signed)
Procedure Name: Intubation Date/Time: 06/04/2016 9:03 AM Performed by: Doreen Salvage Pre-anesthesia Checklist: Patient identified, Emergency Drugs available, Suction available and Patient being monitored Patient Re-evaluated:Patient Re-evaluated prior to inductionOxygen Delivery Method: Circle system utilized Preoxygenation: Pre-oxygenation with 100% oxygen Intubation Type: IV induction, Cricoid Pressure applied and Rapid sequence Ventilation: Mask ventilation without difficulty Laryngoscope Size: Mac and 3 Grade View: Grade III Tube type: Oral Tube size: 7.0 mm Number of attempts: 1 Airway Equipment and Method: Stylet Placement Confirmation: ETT inserted through vocal cords under direct vision,  positive ETCO2 and breath sounds checked- equal and bilateral Secured at: 21 cm Tube secured with: Tape Dental Injury: Teeth and Oropharynx as per pre-operative assessment  Difficulty Due To: Difficult Airway- due to anterior larynx

## 2016-06-04 NOTE — Progress Notes (Signed)
Pt stated to RN that "she feels like her acid reflux is starting to act up". Pt asked RN if she could get another dose of protonix. Dr. Burt Knack notified, new orders for Maalox 42ml Q2 PRN.   Malai [employer]

## 2016-06-04 NOTE — ED Notes (Signed)
Pt states unable to give urine sample at this time 

## 2016-06-04 NOTE — H&P (Addendum)
Patient ID: Taylor Simmons, female   DOB: 19-Jun-1945, 71 y.o.   MRN: [id number]  HPI Taylor Simmons is a 71 y.o. female seen at the request of the emergency room for abdominal pain. She reports that the pain started at 11:30 AM after she went to [employer]. The pain is now constant and moderate to severe in intensity are located in the right lower quadrant. It is sharp type pain and worsening with certain movement. She reports feeling cold and no objective fevers. She has had nausea and had had multiple emesis about the day. Part of the workup included a CT scan that I have personally reviewed showing evidence of an inflammatory response around the appendix with a small amount of fluid and some fat stranding concerning for perforation. There is no abscess and there is no free air. No evidence of ischemic bowel. WBC is 18k. Of note she is able to perform more than 6 Mets of activity without any shortness of breath or chest pain. She is in pretty good health surgeries is a breast augmentation several years ago  HPI  Past Medical History:  Diagnosis Date  . Arm fracture, left   . Constipation    Followed by Dr. Tiffany Kocher  . Hip bursitis   . Hyperlipidemia   . Myalgia    from statin    Past Surgical History:  Procedure Laterality Date  . BREAST SURGERY     implants  . UPPER GASTROINTESTINAL ENDOSCOPY  2010   Dr. Tiffany Kocher    Family History  Problem Relation Age of Onset  . Hypertension Mother   . Bipolar disorder Sister   . Panic disorder Son     Social History Social History  Substance Use Topics  . Smoking status: Former Smoker    Quit date: 11/04/2006  . Smokeless tobacco: Not on file  . Alcohol use Yes     Comment: occassionally    No Known Allergies  Current Facility-Administered Medications  Medication Dose Route Frequency Provider Last Rate Last Dose  . piperacillin-tazobactam (ZOSYN) IVPB 3.375 g  3.375 g Intravenous Once Hinda Kehr, MD 100 mL/hr at 06/04/16  0631 3.375 g at 06/04/16 0631  . sodium chloride 0.9 % bolus 1,000 mL  1,000 mL Intravenous Once Jules Husbands, MD 1,000 mL/hr at 06/04/16 0631 1,000 mL at 06/04/16 0631   Current Outpatient Prescriptions  Medication Sig Dispense Refill  . AMITIZA 24 MCG capsule Take 24 mcg by mouth 2 (two) times daily.     . clonazePAM (KLONOPIN) 1 MG tablet Take 1 tablet (1 mg total) by mouth at bedtime as needed. 30 tablet 3  . DEXILANT 60 MG capsule Take 1 capsule by mouth daily.     . fluticasone (FLONASE) 50 MCG/ACT nasal spray Place 2 sprays into the nose daily. 16 g 6  . gabapentin (NEURONTIN) 100 MG capsule     . Lactulose 20 GM/30ML SOLN Take 30 mLs (20 g total) by mouth as needed (Take 23ml every 2hr until BM, up to 3 doses twice weekly). 500 mL 6  . rosuvastatin (CRESTOR) 20 MG tablet Take 1 tablet (20 mg total) by mouth daily. 30 tablet 6     Review of Systems A 10 point review of systems was asked and was negative except for the information on the HPI  Physical Exam Blood pressure (!) 123/57, pulse 86, temperature 98.4 F (36.9 C), temperature source Oral, resp. rate 16, height 5\' 5"  (1.651 m), weight 80.3 kg (177  lb), SpO2 96 %. CONSTITUTIONAL: NAD , non toxic EYES: Pupils are equal, round, and reactive to light, Sclera are non-icteric. EARS, NOSE, MOUTH AND THROAT: The oropharynx is clear. The oral mucosa is pink and moist. Hearing is intact to voice. LYMPH NODES:  Lymph nodes in the neck are normal. RESPIRATORY:  Lungs are clear. There is normal respiratory effort, with equal breath sounds bilaterally, and without pathologic use of accessory muscles. CARDIOVASCULAR: Heart is regular without murmurs, gallops, or rubs. GI: The abdomen is  soft, TTP in the RLQ w focal peritonitis, no diffuse peritonitis , no rebound tenderness. GU: Rectal deferred.   MUSCULOSKELETAL: Normal muscle strength and tone. No cyanosis or edema.   SKIN: Turgor is good and there are no pathologic skin lesions or  ulcers. NEUROLOGIC: Motor and sensation is grossly normal. Cranial nerves are grossly intact. PSYCH:  Oriented to person, place and time. Affect is normal.  Data Reviewed I have personally reviewed the patient's imaging, laboratory findings and medical records.    Assessment/Plan 71 yo female with classic signs and symptoms of acute appendicitis confirmed by CT scan. I do recommend proceeding to the operating room for an appendectomy. We have start IV fluids and broad-spectrum antibiotics  The risks, benefits, complications, treatment options, and expected outcomes were discussed with the patient. Discussed continuing to the operating room for Laparoscopic Appendectomy possible open.  The possibilities of  bleeding, recurrent infection, perforation of viscus, finding a normal appendix, the need for additional procedures, failure to diagnose a condition, conversion to open procedure and creating a complication requiring transfusion or further operations were discussed. The patient was given the opportunity to ask questions and have them answered.  Patient would like to proceed with Laparoscopic Appendectomy. Since it is a change of our shift Dr. Burt Knack will be performing the surgery. She understands    Caroleen Hamman, MD FACS General Surgeon 06/04/2016, 6:58 AM

## 2016-06-04 NOTE — ED Provider Notes (Signed)
[facility] Emergency Department Provider Note  ____________________________________________   First MD Initiated Contact with Patient 06/04/16 [phone]     (approximate)  I have reviewed the triage vital signs and the nursing notes.   HISTORY  Chief Complaint Emesis and Abdominal Pain    HPI Taylor Simmons is a 71 y.o. female who presents for evaluation of lower abdominal pain.  She states that it started more than 12 hours prior to arrival, at about 11:30 yesterday morning.  It started gradually and has been consistently getting worse.  It has never gone away.  There is no upper abdominal pain, it is all lower and on both sides but worse on the right.  She was ambulating without any difficulty although she states that it does reproduce the pain.  She has had some nausea and vomiting although only the nausea is persistent at this time.  No diarrhea, no dysuria.  She denies fever/chills, chest pain, shortness of breath.  The pain is severe and nothing is making it better.  She has not had any abdominal surgeries in the past.   Past Medical History:  Diagnosis Date  . Arm fracture, left   . Constipation    Followed by Dr. Tiffany Kocher  . Hip bursitis   . Hyperlipidemia   . Myalgia    from statin    Patient Active Problem List   Diagnosis Date Noted  . Acute appendicitis with localized peritonitis   . Bronchitis 08/01/2011  . Abdominal bloating 07/14/2011  . Constipation 02/13/2011  . Hyperlipidemia 01/05/2011    Past Surgical History:  Procedure Laterality Date  . BREAST SURGERY     implants  . UPPER GASTROINTESTINAL ENDOSCOPY  2010   Dr. Tiffany Kocher    Prior to Admission medications   Medication Sig Start Date End Date Taking? Authorizing Provider  atorvastatin (LIPITOR) 20 MG tablet Take 1 tablet by mouth daily. 06/01/16  Yes Historical Provider, MD  clonazePAM (KLONOPIN) 0.5 MG tablet Take 1 tablet by mouth daily. 06/02/16  Yes Historical  Provider, MD  DEXILANT 60 MG capsule Take 1 capsule by mouth daily.  12/31/10  Yes Historical Provider, MD  fluticasone (FLONASE) 50 MCG/ACT nasal spray Place 2 sprays into the nose as needed. 08/31/14  Yes Historical Provider, MD  hydrochlorothiazide (HYDRODIURIL) 25 MG tablet Take 1 tablet by mouth daily. 06/01/16  Yes Historical Provider, MD  LINZESS 290 MCG CAPS capsule Take 1 capsule by mouth at bedtime. 06/01/16  Yes Historical Provider, MD  MAGNESIUM PO Take 250 mg by mouth daily.   Yes Historical Provider, MD  Melatonin 5 MG TABS Take 1 tablet by mouth at bedtime as needed.   Yes Historical Provider, MD  sertraline (ZOLOFT) 50 MG tablet Take 1 tablet by mouth daily. 06/01/16  Yes Historical Provider, MD  traZODone (DESYREL) 50 MG tablet Take 1-2 tablets by mouth at bedtime as needed. 07/27/15 07/26/16 Yes Historical Provider, MD  XENICAL 120 MG capsule Take 1 capsule by mouth 3 (three) times daily. 06/01/16  Yes Historical Provider, MD    Allergies Hydrocodone  Family History  Problem Relation Age of Onset  . Hypertension Mother   . Bipolar disorder Sister   . Panic disorder Son     Social History Social History  Substance Use Topics  . Smoking status: Former Smoker    Quit date: 11/04/2006  . Smokeless tobacco: Not on file  . Alcohol use Yes     Comment: occassionally    Review  of Systems Constitutional: No fever/chills Eyes: No visual changes. ENT: No sore throat. Cardiovascular: Denies chest pain. Respiratory: Denies shortness of breath. Gastrointestinal: Lower abd pain w/ N/V Genitourinary: Negative for dysuria. Musculoskeletal: Negative for back pain. Skin: Negative for rash. Neurological: Negative for headaches, focal weakness or numbness.  10-point ROS otherwise negative.  ____________________________________________   PHYSICAL EXAM:  VITAL SIGNS: ED Triage Vitals  Enc Vitals Group     BP 06/04/16 0222 (!) 142/70     Pulse Rate 06/04/16 0222 91     Resp 06/04/16  0222 20     Temp 06/04/16 0223 99.8 F (37.7 C)     Temp Source 06/04/16 0223 Oral     SpO2 06/04/16 0330 100 %     Weight 06/04/16 0216 177 lb (80.3 kg)     Height 06/04/16 0216 5\' 5"  (1.651 m)     Head Circumference --      Peak Flow --      Pain Score 06/04/16 0216 8     Pain Loc --      Pain Edu? --      Excl. in [place]? --     Constitutional: Alert and oriented. Well appearing and in no acute distress. Eyes: Conjunctivae are normal. PERRL. EOMI. Head: Atraumatic. Nose: No congestion/rhinnorhea. Mouth/Throat: Mucous membranes are moist.  Oropharynx non-erythematous. Neck: No stridor.  No meningeal signs.   Cardiovascular: Normal rate, regular rhythm. Good peripheral circulation. Grossly normal heart sounds. Respiratory: Normal respiratory effort.  No retractions. Lungs CTAB. Gastrointestinal: Soft With localized tenderness to palpation in the right lower quadrant.  Mild rebound, no guarding.  No upper abdominal tenderness. Musculoskeletal: No lower extremity tenderness nor edema. No gross deformities of extremities. Neurologic:  Normal speech and language. No gross focal neurologic deficits are appreciated.  Skin:  Skin is warm, dry and intact. No rash noted. Psychiatric: Mood and affect are normal. Speech and behavior are normal.  ____________________________________________   LABS (all labs ordered are listed, but only abnormal results are displayed)  Labs Reviewed  COMPREHENSIVE METABOLIC PANEL - Abnormal; Notable for the following:       Result Value   Potassium 3.1 (*)    Chloride 99 (*)    Glucose, Bld 152 (*)    Calcium 8.8 (*)    All other components within normal limits  CBC - Abnormal; Notable for the following:    WBC 18.1 (*)    All other components within normal limits  URINALYSIS, COMPLETE (UACMP) WITH MICROSCOPIC - Abnormal; Notable for the following:    Color, Urine YELLOW (*)    APPearance CLEAR (*)    Hgb urine dipstick SMALL (*)    Leukocytes, UA  TRACE (*)    Squamous Epithelial / LPF 6-30 (*)    All other components within normal limits  LIPASE, BLOOD   ____________________________________________  EKG  ED ECG REPORT I, Leenah Seidner, the attending physician, personally viewed and interpreted this ECG.  Date: 06/04/2016 EKG Time: 6:36 AM Rate: 87 Rhythm: normal sinus rhythm QRS Axis: normal Intervals: Prolonged QTc interval at 576 ms ST/T Wave abnormalities: Non-specific ST segment / T-wave changes, but no evidence of acute ischemia. Conduction Disturbances: none Narrative Interpretation: unremarkable  ____________________________________________  RADIOLOGY   Ct Abdomen Pelvis W Contrast  Result Date: 06/04/2016 CLINICAL DATA:  Nausea, vomiting and abdominal pain beginning yesterday morning now with bloating. EXAM: CT ABDOMEN AND PELVIS WITH CONTRAST TECHNIQUE: Multidetector CT imaging of the abdomen and pelvis was performed using the  standard protocol following bolus administration of intravenous contrast. CONTRAST:  12mL ISOVUE-300 IOPAMIDOL (ISOVUE-300) INJECTION 61% COMPARISON:  CT abdomen and pelvis November 29, 2016 FINDINGS: LOWER CHEST: Dependent atelectasis. Included heart size is normal. No pericardial effusion. HEPATOBILIARY: Low-density liver compatible with steatosis with minimal focal fatty sparing about the gallbladder fossa. Gallbladder is normal. PANCREAS: Normal. SPLEEN: Normal. ADRENALS/URINARY TRACT: Kidneys are orthotopic, demonstrating symmetric enhancement. No nephrolithiasis, hydronephrosis or solid renal masses. The unopacified ureters are normal in course and caliber. Delayed imaging through the kidneys demonstrates symmetric prompt contrast excretion within the proximal urinary collecting system. Urinary bladder is partially distended and unremarkable. Normal adrenal glands. STOMACH/BOWEL: The appendix is enlarged, 14 mm with periappendiceal inflammation. Focal fat stranding and small effusion surrounding  mid appendix. No appendicolith. Mild inflammatory changes of the cecum are likely reactive. Small hiatal hernia. The stomach, small bowel are normal in course and caliber without inflammatory changes. Severe sigmoid diverticulosis. VASCULAR/LYMPHATIC: Aortoiliac vessels are normal in course and caliber, moderate calcific atherosclerosis. No lymphadenopathy by CT size criteria. REPRODUCTIVE: Normal. OTHER: No drainable fluid collection or intraperitoneal free air. MUSCULOSKELETAL: Nonacute. Calcified bilateral breast implants. Mild degenerative changes lower lumbar spine. Small to moderate fat containing umbilical hernia. IMPRESSION: Acute appendicitis, with suspected perforation. No drainable fluid collection. Severe sigmoid diverticulosis. Acute findings discussed with and reconfirmed by Dr.Demetrick Eichenberger [facility] on 06/04/2016 at 6:21 am. Electronically Signed   By: Elon Alas M.D.   On: 06/04/2016 06:21    ____________________________________________   PROCEDURES  Procedure(s) performed:   Procedures   Critical Care performed: No ____________________________________________   INITIAL IMPRESSION / ASSESSMENT AND PLAN / ED COURSE  Pertinent labs & imaging results that were available during my care of the patient were reviewed by me and considered in my medical decision making (see chart for details).  Labs notable for leukocytosis of 18 and a potassium that is slightly low.  I will obtain a CT scan of her abdomen and pelvis for further evaluation.   Clinical Course as of Jun 05 819  Sun Jun 04, 2016  [id number] Dr. Dorann Lodge called to let me know the patient has acute appendicitis that appears perforated but without abscess.  I ordered Zosyn and call Dr. Dahlia Byes.  Discussed the patient by phone and he will come to the ED to personally evaluate the patient and admit her.  I updated the patient as well and she understands and agrees with the plan and declined any pain medicine or nausea medicine at this  time.  [CF]    Clinical Course User Index [CF] Hinda Kehr, MD    ____________________________________________  FINAL CLINICAL IMPRESSION(S) / ED DIAGNOSES  Final diagnoses:  Acute appendicitis with localized peritonitis  Prolonged Q-T interval on ECG     MEDICATIONS GIVEN DURING THIS VISIT:  Medications  ondansetron (ZOFRAN) injection 4 mg (4 mg Intravenous Given 06/04/16 0341)  ondansetron (ZOFRAN) injection 4 mg (4 mg Intravenous Given 06/04/16 0519)  morphine 4 MG/ML injection 4 mg (4 mg Intravenous Given 06/04/16 0519)  iopamidol (ISOVUE-300) 61 % injection 30 mL (30 mLs Oral Contrast Given 06/04/16 0525)  iopamidol (ISOVUE-300) 61 % injection 100 mL (100 mLs Intravenous Contrast Given 06/04/16 0602)  piperacillin-tazobactam (ZOSYN) IVPB 3.375 g (0 g Intravenous Stopped 06/04/16 0735)  sodium chloride 0.9 % bolus 1,000 mL (1,000 mLs Intravenous New Bag/Given 06/04/16 0631)  morphine 4 MG/ML injection 4 mg (4 mg Intravenous Given 06/04/16 0654)     NEW OUTPATIENT MEDICATIONS STARTED DURING THIS VISIT:  New Prescriptions  No medications on file    Modified Medications   No medications on file    Discontinued Medications   AMITIZA 24 MCG CAPSULE    Take 24 mcg by mouth 2 (two) times daily.    CLONAZEPAM (KLONOPIN) 1 MG TABLET    Take 1 tablet (1 mg total) by mouth at bedtime as needed.   FLUTICASONE (FLONASE) 50 MCG/ACT NASAL SPRAY    Place 2 sprays into the nose daily.   GABAPENTIN (NEURONTIN) 100 MG CAPSULE       LACTULOSE 20 GM/30ML SOLN    Take 30 mLs (20 g total) by mouth as needed (Take 38ml every 2hr until BM, up to 3 doses twice weekly).   LORATADINE 10 MG CAPS    Take 1 tablet by mouth daily as needed.   ROSUVASTATIN (CRESTOR) 20 MG TABLET    Take 1 tablet (20 mg total) by mouth daily.     Note:  This document was prepared using Dragon voice recognition software and may include unintentional dictation errors.    Hinda Kehr, MD 06/04/16 [phone]

## 2016-06-04 NOTE — Anesthesia Preprocedure Evaluation (Signed)
Anesthesia Evaluation  Patient identified by MRN, date of birth, ID band Patient awake    Reviewed: Allergy & Precautions, NPO status , Patient's Chart, lab work & pertinent test results  History of Anesthesia Complications Negative for: history of anesthetic complications  Airway Mallampati: III  TM Distance: >3 FB Neck ROM: Full    Dental  (+) Implants   Pulmonary sleep apnea and Continuous Positive Airway Pressure Ventilation , neg COPD, former smoker,    breath sounds clear to auscultation- rhonchi (-) wheezing      Cardiovascular Exercise Tolerance: Good (-) hypertension(-) CAD and (-) Past MI  Rhythm:Regular Rate:Normal - Systolic murmurs and - Diastolic murmurs    Neuro/Psych negative neurological ROS  negative psych ROS   GI/Hepatic Neg liver ROS, Acute appendicitis   Endo/Other  negative endocrine ROSneg diabetes  Renal/GU negative Renal ROS     Musculoskeletal negative musculoskeletal ROS (+)   Abdominal (+) - obese,   Peds  Hematology negative hematology ROS (+)   Anesthesia Other Findings Past Medical History: No date: Arm fracture, left No date: Constipation     Comment: Followed by Dr. Tiffany Kocher No date: Hip bursitis No date: Hyperlipidemia No date: Myalgia     Comment: from statin   Reproductive/Obstetrics                             Anesthesia Physical Anesthesia Plan  ASA: II and emergent  Anesthesia Plan: General   Post-op Pain Management:    Induction: Intravenous, Rapid sequence and Cricoid pressure planned  Airway Management Planned: Oral ETT  Additional Equipment:   Intra-op Plan:   Post-operative Plan: Extubation in OR  Informed Consent: I have reviewed the patients History and Physical, chart, labs and discussed the procedure including the risks, benefits and alternatives for the proposed anesthesia with the patient or authorized representative who  has indicated his/her understanding and acceptance.   Dental advisory given  Plan Discussed with: CRNA and Anesthesiologist  Anesthesia Plan Comments:         Anesthesia Quick Evaluation

## 2016-06-04 NOTE — ED Triage Notes (Signed)
Pt reports abd pain since 1130 yesterday morning; N/V started after she tried to eat-thought pain was related to hunger; no having upper abd bloating; ambulatory with steady gait; talking in complete coherent sentences

## 2016-06-04 NOTE — Op Note (Signed)
laparascopic appendectomy   Brett Albino Date of operation:  06/04/2016  Indications: The patient presented with a history of  abdominal pain. Workup has revealed findings consistent with acute appendicitis, ruptured  Pre-operative Diagnosis: Acute appendicitis, ruptured  Post-operative Diagnosis: Ruptured appendicitis  Surgeon: Jerrol Banana. Burt Knack, MD, FACS  Anesthesia: General with endotracheal tube  Procedure Details  The patient was seen again in the preop area. The options of surgery versus observation were reviewed with the patient and/or family. The risks of bleeding, infection, recurrence of symptoms, negative laparoscopy, potential for an open procedure, bowel injury, abscess or infection, were all reviewed as well. The patient was taken to Operating Room, identified as Taylor Simmons and the procedure verified as laparoscopic appendectomy. A Time Out was held and the above information confirmed.  The patient was placed in the supine position and general anesthesia was induced.  Antibiotic prophylaxis was administered and VT E prophylaxis was in place. A Foley catheter was placed by the nursing staff.   The abdomen was prepped and draped in a sterile fashion. An infraumbilical incision was made. A Veress needle was placed and pneumoperitoneum was obtained. A 5 mm trocar port was placed without difficulty and the abdominal cavity was explored.  Under direct vision a 5 mm suprapubic port was placed and a 13 mm left lateral port was placed all under direct vision.  The appendix was identified and found to be acutely inflamed and in the retrocecal position. Grasping of the base of the appendix allowed for dissection cephalad and posteriorly along the retroperitoneum. Some of this required sharp dissection of the lateral peritoneal reflection without the use of energy. To aid in dissection a right upper quadrant 5 mm port was placed under direct vision to retract small bowel  that obscured the view of the base of the appendix. The appendix was found to be ruptured and came out in pieces.  The appendix was carefully dissected. The base of the appendix was dissected out and divided with a standard load Endo GIA. The appendix had been found in pieces and there was free-floating fecaliths which were retrieved as well as liquid stool which was aspirated and irrigated. The mesoappendix was divided with a vascular load Endo GIA. And multiple clips were utilized on larger vessels as well. The appendix was passed out through the left lateral port site with the aid of an Endo Catch bag. Smaller pieces of the appendix had been passed out and combined with the specimen. The right lower quadrant and pelvis was then irrigated with copious amounts of normal saline which was aspirated. Inspection  failed to identify any additional bleeding and there were no signs of bowel injury. A 10 mm JP drain was brought in through the left lateral port site and out the suprapubic site and placed into the retroperitoneum around the base of the appendix. It was tied in with a 3-0 nylon. Therefore the left lateral port site was closed under direct vision utilizing an Endo Close technique with 0 Vicryl interrupted sutures, all under direct vision.   Again the right lower quadrant was inspected there was no sign of bleeding or bowel injury therefore pneumoperitoneum was released, all ports were removed and the skin incisions were approximated with subcuticular 4-0 Monocryl. Steri-Strips and Mastisol and sterile dressings were placed.  The patient tolerated the procedure well, there were no complications. The sponge lap and needle count were correct at the end of the procedure.  The patient was taken to  the recovery room in stable condition to be admitted for continued care.  Findings: Ruptured appendix in a retrocecal position with free-floating fecaliths and liquid stool and pus.  Estimated Blood Loss: 50  ML's  Drain times one JP              Specimens: appendix         Complications:  None                  Richard E. Burt Knack MD, FACS

## 2016-06-05 ENCOUNTER — Encounter: Payer: Self-pay | Admitting: Surgery

## 2016-06-05 LAB — BASIC METABOLIC PANEL
Anion gap: 6 (ref 5–15)
BUN: 9 mg/dL (ref 6–20)
CALCIUM: 7.8 mg/dL — AB (ref 8.9–10.3)
CO2: 33 mmol/L — AB (ref 22–32)
Chloride: 106 mmol/L (ref 101–111)
Creatinine, Ser: 0.77 mg/dL (ref 0.44–1.00)
GFR calc Af Amer: >60 mL/min (ref >60)
Glucose, Bld: 135 mg/dL — ABNORMAL HIGH (ref 65–99)
Potassium: 2.8 mmol/L — ABNORMAL LOW (ref 3.5–5.1)
Sodium: 145 mmol/L (ref 135–145)

## 2016-06-05 LAB — CBC
HEMATOCRIT: 34.8 % — AB (ref 35.0–47.0)
Hemoglobin: 12 g/dL (ref 12.0–16.0)
MCH: 32.4 pg (ref 26.0–34.0)
MCHC: 34.5 g/dL (ref 32.0–36.0)
MCV: 93.9 fL (ref 80.0–100.0)
Platelets: 205 10*3/uL (ref 150–440)
RBC: 3.7 MIL/uL — ABNORMAL LOW (ref 3.80–5.20)
RDW: 13.2 % (ref 11.5–14.5)
WBC: 14 10*3/uL — ABNORMAL HIGH (ref 3.6–11.0)

## 2016-06-05 MED ORDER — POTASSIUM CHLORIDE CRYS ER 20 MEQ PO TBCR
40.0000 meq | EXTENDED_RELEASE_TABLET | Freq: Once | ORAL | Status: AC
  Administered 2016-06-05: 40 meq via ORAL
  Filled 2016-06-05: qty 2

## 2016-06-05 MED ORDER — MORPHINE SULFATE (PF) 4 MG/ML IV SOLN: 2.0000 mg | INTRAVENOUS | Status: DC | PRN

## 2016-06-05 MED ORDER — OXYCODONE HCL 5 MG PO TABS
5.0000 mg | ORAL_TABLET | ORAL | Status: DC | PRN
  Administered 2016-06-06 – 2016-06-07 (×2): 5 mg via ORAL
  Filled 2016-06-05 (×2): qty 1

## 2016-06-05 MED ORDER — PROCHLORPERAZINE EDISYLATE 5 MG/ML IJ SOLN
10.0000 mg | Freq: Four times a day (QID) | INTRAMUSCULAR | Status: DC | PRN
  Administered 2016-06-05: 10 mg via INTRAVENOUS
  Filled 2016-06-05: qty 2

## 2016-06-05 MED ORDER — KETOROLAC TROMETHAMINE 30 MG/ML IJ SOLN
30.0000 mg | Freq: Four times a day (QID) | INTRAMUSCULAR | Status: DC
  Administered 2016-06-05 – 2016-06-06 (×4): 30 mg via INTRAVENOUS
  Filled 2016-06-05 (×4): qty 1

## 2016-06-05 NOTE — Progress Notes (Signed)
Foley removed per MD order. Pt is up to chair.

## 2016-06-05 NOTE — Progress Notes (Signed)
06/05/2016  Subjective: Patient is 1 Day Post-Op s/p laparoscopic appendectomy for ruptured appendicitis.  No acute events overnight. Patient reports being very sore.  Denies any nausea or vomiting and tolerated clears.  Vital signs: Temp:  [97.9 F (36.6 C)-99.4 F (37.4 C)] 98.8 F (37.1 C) (02/05 0824) Pulse Rate:  [52-73] 61 (02/05 0824) Resp:  [8-18] 17 (02/05 0824) BP: (105-114)/(46-61) 113/54 (02/05 0824) SpO2:  [93 %-96 %] 96 % (02/05 0824)   Intake/Output: 02/04 0701 - 02/05 0700 In: 2844.9 [P.O.:120; I.V.:2595.9; IV Piggyback:129] Out: 2745 [Urine:2555; Drains:140; Blood:50] Last BM Date: 06/04/16  Physical Exam: Constitutional: No acute distress Abdomen:  Soft, nondistended, appropriately tender to palpation.  JP drain with serosanguinous fluid.  Labs:   Recent Labs  06/04/16 0220 06/05/16 0802  WBC 18.1* 14.0*  HGB 13.8 12.0  HCT 40.0 34.8*  PLT 260 205    Recent Labs  06/04/16 0220 06/05/16 0802  NA 139 145  K 3.1* 2.8*  CL 99* 106  CO2 29 33*  GLUCOSE 152* 135*  BUN 10 9  CREATININE 0.82 0.77  CALCIUM 8.8* 7.8*   No results for input(s): LABPROT, INR in the last 72 hours.  Imaging: No results found.  Assessment/Plan: 71 yo female s/p laparoscopic appendectomy  --Advance diet to soft diet today. --discontinue IV fluids, discontinue foley catheter. --OOB, ambulate --continue IV antibiotics as still having elevated WBC.  Likely transition to oral tomorrow. --Replete potassium for hypokalemia x 2 oral doses today. --Keep JP drain in place.   Melvyn Neth, [place]

## 2016-06-05 NOTE — Progress Notes (Signed)
Nutrition Brief Note  Patient identified on the Malnutrition Screening Tool (MST) Report  Wt Readings from Last 15 Encounters:  06/04/16 177 lb (80.3 kg)  08/01/11 194 lb (88 kg)  07/14/11 189 lb (85.7 kg)  05/17/11 183 lb (83 kg)  02/13/11 181 lb 4 oz (82.2 kg)  01/05/11 182 lb (82.6 kg)   Patient reports her appetite is good now and PTA. She reports she has lost 20 lbs over the past 6-8 months intentionally with diet and exercise. RD answered patient's questions about healthy weight loss.  Nutrition-Focused physical exam completed. Findings are no fat depletion, no muscle depletion, and no edema.   Body mass index is 29.45 kg/m. Patient meets criteria for Overweight based on current BMI.   Current diet order is Soft diet, patient is consuming approximately 90% of meals at this time. Labs and medications reviewed.   No nutrition interventions warranted at this time. If nutrition issues arise, please consult RD.   Willey Blade, MS, RD, LDN Pager: [phone] After Hours Pager: [phone]

## 2016-06-06 LAB — BASIC METABOLIC PANEL
Anion gap: 5 (ref 5–15)
BUN: 15 mg/dL (ref 6–20)
CO2: 29 mmol/L (ref 22–32)
Calcium: 7.9 mg/dL — ABNORMAL LOW (ref 8.9–10.3)
Chloride: 110 mmol/L (ref 101–111)
Creatinine, Ser: 0.82 mg/dL (ref 0.44–1.00)
Glucose, Bld: 100 mg/dL — ABNORMAL HIGH (ref 65–99)
Potassium: 3.5 mmol/L (ref 3.5–5.1)
SODIUM: 144 mmol/L (ref 135–145)

## 2016-06-06 LAB — CBC WITH DIFFERENTIAL/PLATELET
BASOS ABS: 0 10*3/uL (ref 0–0.1)
BASOS PCT: 0 %
EOS ABS: 0.1 10*3/uL (ref 0–0.7)
EOS PCT: 1 %
HCT: 31.7 % — ABNORMAL LOW (ref 35.0–47.0)
Hemoglobin: 11.1 g/dL — ABNORMAL LOW (ref 12.0–16.0)
LYMPHS ABS: 2.6 10*3/uL (ref 1.0–3.6)
Lymphocytes Relative: 29 %
MCH: 33 pg (ref 26.0–34.0)
MCHC: 34.9 g/dL (ref 32.0–36.0)
MCV: 94.4 fL (ref 80.0–100.0)
Monocytes Absolute: 0.5 10*3/uL (ref 0.2–0.9)
Monocytes Relative: 6 %
Neutro Abs: 5.8 10*3/uL (ref 1.4–6.5)
Neutrophils Relative %: 64 %
PLATELETS: 191 10*3/uL (ref 150–440)
RBC: 3.35 MIL/uL — AB (ref 3.80–5.20)
RDW: 13 % (ref 11.5–14.5)
WBC: 9 10*3/uL (ref 3.6–11.0)

## 2016-06-06 LAB — SURGICAL PATHOLOGY

## 2016-06-06 LAB — MAGNESIUM: MAGNESIUM: 2.2 mg/dL (ref 1.7–2.4)

## 2016-06-06 MED ORDER — POTASSIUM CHLORIDE CRYS ER 20 MEQ PO TBCR
40.0000 meq | EXTENDED_RELEASE_TABLET | Freq: Once | ORAL | Status: AC
  Administered 2016-06-06: 40 meq via ORAL
  Filled 2016-06-06: qty 2

## 2016-06-06 MED ORDER — AMOXICILLIN-POT CLAVULANATE 875-125 MG PO TABS
1.0000 | ORAL_TABLET | Freq: Two times a day (BID) | ORAL | Status: DC
  Administered 2016-06-06 – 2016-06-07 (×3): 1 via ORAL
  Filled 2016-06-06 (×4): qty 1

## 2016-06-06 NOTE — Progress Notes (Signed)
Per MD, okay to leave IV out. 

## 2016-06-06 NOTE — Progress Notes (Signed)
06/06/2016  Subjective: Patient is 2 Days Post-Op status post laparoscopic appendectomy for perforated appendicitis. No acute events overnight. Patient reports that her pain is better controlled today. Tolerated a soft diet without issues. Did have one episode of nausea but she thinks is related to medication given. Reports having flatus yesterday.  Vital signs: Temp:  [97.9 F (36.6 C)-98.9 F (37.2 C)] 97.9 F (36.6 C) (02/06 0733) Pulse Rate:  [55-64] 55 (02/06 0733) Resp:  [16-19] 18 (02/06 0439) BP: (102-141)/(36-60) 117/44 (02/06 0733) SpO2:  [94 %-97 %] 96 % (02/06 0733)   Intake/Output: 02/05 0701 - 02/06 0700 In: 738.6 [P.O.:240; I.V.:429; IV Piggyback:69.6] Out: 510 [Urine:450; Drains:60] Last BM Date: 06/05/16  Physical Exam: Constitutional: No acute distress Abdomen:  Soft, nondistended, appropriately tender to palpation. Incisions are clean dry and intact with Steri-Strips in place. No evidence of infection. JP drain with serosanguineous fluid in the bulb.  Labs:   Recent Labs  06/05/16 0802 06/06/16 0404  WBC 14.0* 9.0  HGB 12.0 11.1*  HCT 34.8* 31.7*  PLT 205 191    Recent Labs  06/05/16 0802 06/06/16 0404  NA 145 144  K 2.8* 3.5  CL 106 110  CO2 33* 29  GLUCOSE 135* 100*  BUN 9 15  CREATININE 0.77 0.82  CALCIUM 7.8* 7.9*   No results for input(s): LABPROT, INR in the last 72 hours.  Imaging: No results found.  Assessment/Plan: 71 year old female status post laparoscopic appendectomy.  -Change to a regular diet today. -Continue IV Zosyn and start oral Augmentin. -Repeat CBC in the morning. -Hypokalemia improved with oral potassium yesterday. Will give one more dose this morning. -Likely discharge to home tomorrow 2/7 and possibly will remove JP drain prior to discharge.   Melvyn Neth, [place]

## 2016-06-07 LAB — CBC
HEMATOCRIT: 33 % — AB (ref 35.0–47.0)
HEMOGLOBIN: 11.5 g/dL — AB (ref 12.0–16.0)
MCH: 32.6 pg (ref 26.0–34.0)
MCHC: 34.7 g/dL (ref 32.0–36.0)
MCV: 94 fL (ref 80.0–100.0)
Platelets: 231 10*3/uL (ref 150–440)
RBC: 3.51 MIL/uL — AB (ref 3.80–5.20)
RDW: 12.9 % (ref 11.5–14.5)
WBC: 6 10*3/uL (ref 3.6–11.0)

## 2016-06-07 MED ORDER — AMOXICILLIN-POT CLAVULANATE 875-125 MG PO TABS: 1.0000 | ORAL_TABLET | Freq: Two times a day (BID) | ORAL | 0 refills | Status: AC

## 2016-06-07 MED ORDER — IBUPROFEN 600 MG PO TABS: 600.0000 mg | ORAL_TABLET | Freq: Three times a day (TID) | ORAL | 0 refills | Status: AC | PRN

## 2016-06-07 MED ORDER — OXYCODONE HCL 5 MG PO TABS: 5.0000 mg | ORAL_TABLET | ORAL | 0 refills | Status: DC | PRN

## 2016-06-07 NOTE — Care Management Important Message (Signed)
Important Message  Patient Details  Name: Taylor Simmons MRN: [id number] Date of Birth: Jun 25, 1945   Medicare Important Message Given:  Yes    Beverly Sessions, RN 06/07/2016, 12:18 PM

## 2016-06-07 NOTE — Discharge Summary (Signed)
Patient ID: Taylor Simmons MRN: [id number] DOB/AGE: 06/23/1945 71 y.o.  Admit date: 06/04/2016 Discharge date: 06/07/2016   Discharge Diagnoses:  Active Problems:   Acute appendicitis   Procedures:  Laparoscopic appendectomy  Hospital Course: Patient was admitted on 2/4 with acute appendicitis with concern for perforation. She was seen to the operating room and was noted to have a perforated appendicitis. A JP drain was left in place. Postoperatively her diet was slowly advanced and she continued to be on IV antibiotics until her white blood cell count normalized. She was transition to oral antibiotics and her diet was advanced to regular diet. Her JP drain remained serosanguineous with no evidence of infection. By 2/7 the patient continued to do well with her pain well controlled, ambulating, with no signs of infection, and a normal white blood cell count. She was deemed ready for discharge to home.  Consults: None  Disposition: 01-Home or Self Care  Discharge Instructions    Call MD for:  difficulty breathing, headache or visual disturbances    Complete by:  As directed    Call MD for:  persistant nausea and vomiting    Complete by:  As directed    Call MD for:  redness, tenderness, or signs of infection (pain, swelling, redness, odor or green/yellow discharge around incision site)    Complete by:  As directed    Call MD for:  severe uncontrolled pain    Complete by:  As directed    Call MD for:  temperature >100.4    Complete by:  As directed    Change dressing (specify)    Complete by:  As directed    May apply dry gauze dressing to drain site as needed.  Do not remove steri strips and allow them to fall off on their own.   Diet - low sodium heart healthy    Complete by:  As directed    Discharge instructions    Complete by:  As directed    Patient may shower, but do not scrub wounds heavily and dab dry only.  Do not submerge wounds in pool/tub.   Driving Restrictions     Complete by:  As directed    Do not drive while taking narcotics for pain control.   Increase activity slowly    Complete by:  As directed    Lifting restrictions    Complete by:  As directed    No heavy lifting or pushing of more than 10-15 lbs for 4 weeks.     Allergies as of 06/07/2016      Reactions   Hydrocodone Other (See Comments), Nausea Only      Medication List    TAKE these medications   amoxicillin-clavulanate 875-125 MG tablet Commonly known as:  AUGMENTIN Take 1 tablet by mouth every 12 (twelve) hours.   atorvastatin 20 MG tablet Commonly known as:  LIPITOR Take 1 tablet by mouth daily.   clonazePAM 0.5 MG tablet Commonly known as:  KLONOPIN Take 1 tablet by mouth daily.   DEXILANT 60 MG capsule Generic drug:  dexlansoprazole Take 1 capsule by mouth daily.   fluticasone 50 MCG/ACT nasal spray Commonly known as:  FLONASE Place 2 sprays into the nose as needed.   hydrochlorothiazide 25 MG tablet Commonly known as:  HYDRODIURIL Take 1 tablet by mouth daily.   ibuprofen 600 MG tablet Commonly known as:  ADVIL,MOTRIN Take 1 tablet (600 mg total) by mouth every 8 (eight) hours as needed for fever or  mild pain.   LINZESS 290 MCG Caps capsule Generic drug:  linaclotide Take 1 capsule by mouth at bedtime.   MAGNESIUM PO Take 500 mg by mouth daily.   oxyCODONE 5 MG immediate release tablet Commonly known as:  Oxy IR/ROXICODONE Take 1-2 tablets (5-10 mg total) by mouth every 4 (four) hours as needed for moderate pain or severe pain.   sertraline 50 MG tablet Commonly known as:  ZOLOFT Take 1 tablet by mouth daily.   traZODone 50 MG tablet Commonly known as:  DESYREL Take 1-2 tablets by mouth at bedtime as needed.   XENICAL 120 MG capsule Generic drug:  orlistat Take 1 capsule by mouth 3 (three) times daily.      Follow-up Information    Phoebe Perch, MD Follow up in 2 week(s).   Specialty:  Surgery Contact information: [place] [address] [phone]

## 2016-06-07 NOTE — Progress Notes (Signed)
06/07/2016  Subjective: Patient is 3 Days Post-Op status post upper scopic appendectomy. No acute events overnight. Today the patient reports she is doing well with no worsening pain and tolerating a regular diet. Her white blood cell count remains normal on oral antibiotics.  Vital signs: Temp:  [97.8 F (36.6 C)-98.8 F (37.1 C)] 98 F (36.7 C) (02/07 0736) Pulse Rate:  [53-61] 53 (02/07 0736) Resp:  [16-18] 17 (02/07 0736) BP: (120-148)/(52-73) 128/62 (02/07 0736) SpO2:  [97 %-98 %] 98 % (02/07 0736)   Intake/Output: 02/06 0701 - 02/07 0700 In: 784.9 [P.O.:720; IV Piggyback:64.9] Out: 1967 [Urine:1900; Drains:67] Last BM Date: 06/06/16  Physical Exam: Constitutional: No acute distress Abdomen:  Soft, nondistended, properly tender to palpation. JP drain in place with serosanguineous fluid. No evidence of infection  Labs:   Recent Labs  06/06/16 0404 06/07/16 0511  WBC 9.0 6.0  HGB 11.1* 11.5*  HCT 31.7* 33.0*  PLT 191 231    Recent Labs  06/05/16 0802 06/06/16 0404  NA 145 144  K 2.8* 3.5  CL 106 110  CO2 33* 29  GLUCOSE 135* 100*  BUN 9 15  CREATININE 0.77 0.82  CALCIUM 7.8* 7.9*   No results for input(s): LABPROT, INR in the last 72 hours.  Imaging: No results found.  Assessment/Plan: 71 year old female status post upper scopic appendectomy.  -Discharge patient to home today. -Remove JP drain prior to discharge. -Prescription for Augmentin, oxycodone, and Motrin will be given. -Follow-up with Dr. Burt Knack.   Melvyn Neth, [place]

## 2016-06-07 NOTE — Care Management (Signed)
Patient s/p lap appy.  JP drain to be removed prior to discharge.  Per nursing no needs identified.

## 2016-06-07 NOTE — Progress Notes (Signed)
Patient discharged to home as ordered [rescription for oxycodone  given to patient as ordered. Dressing to abdomen dry and intact. No acute distress noted. Other medication sent electronically to the pharmacy as ordered.

## 2016-06-13 ENCOUNTER — Other Ambulatory Visit: Payer: Self-pay

## 2016-06-13 ENCOUNTER — Telehealth: Payer: Self-pay | Admitting: Surgery

## 2016-06-13 DIAGNOSIS — K219 Gastro-esophageal reflux disease without esophagitis: Secondary | ICD-10-CM | POA: Insufficient documentation

## 2016-06-13 DIAGNOSIS — Z86010 Personal history of colonic polyps: Secondary | ICD-10-CM | POA: Insufficient documentation

## 2016-06-13 DIAGNOSIS — E669 Obesity, unspecified: Secondary | ICD-10-CM | POA: Insufficient documentation

## 2016-06-13 DIAGNOSIS — Z8719 Personal history of other diseases of the digestive system: Secondary | ICD-10-CM | POA: Insufficient documentation

## 2016-06-13 DIAGNOSIS — K5790 Diverticulosis of intestine, part unspecified, without perforation or abscess without bleeding: Secondary | ICD-10-CM | POA: Insufficient documentation

## 2016-06-13 DIAGNOSIS — M8580 Other specified disorders of bone density and structure, unspecified site: Secondary | ICD-10-CM | POA: Insufficient documentation

## 2016-06-13 MED ORDER — ONDANSETRON 4 MG PO TBDP: 4.0000 mg | ORAL_TABLET | Freq: Three times a day (TID) | ORAL | 0 refills | Status: DC | PRN

## 2016-06-13 NOTE — Telephone Encounter (Signed)
Patient called and stated she had a appendectomy last Sunday and is in a lot of pain at this time. She is taking the pain medication that was prescribed. Please call.

## 2016-06-13 NOTE — Telephone Encounter (Signed)
Returned phone call to patient. Patient states that she was feeling ok until she had a strong sneeze last night without warning and has had stabbing abdominal pain since to LLQ abdomen. She only took 1 Oxycodone with Ibuprofen at the time and then 1 again this morning.  Incisions look ok except bruising. Bowels are moving with help of Linzess. Having some nausea with pain medication but denies vomiting. Denies fever or chills. JP was removed prior to leaving the hospital per patient.  I explained to the patient that this does not sound alarming but she needs to get a balance back with her pain medication. She states that she has not been taking this because it is causing her nausea. Patient was placed on schedule this week to see Dr. Adonis Huguenin with the understanding that if she develops fever/chills, vomiting, diarrhea/constipation, or worsening abdominal pain- she is to call immediately and be worked in.  Zofran sent to pharmacy per Dr. Adonis Huguenin.  Patient verbalizes understanding.

## 2016-06-15 ENCOUNTER — Ambulatory Visit (INDEPENDENT_AMBULATORY_CARE_PROVIDER_SITE_OTHER): Payer: Medicare Other | Admitting: General Surgery

## 2016-06-15 ENCOUNTER — Other Ambulatory Visit
Admission: RE | Admit: 2016-06-15 | Discharge: 2016-06-15 | Disposition: A | Discharge status: Home / Self Care | Payer: Medicare Other | Source: Ambulatory Visit | Attending: General Surgery | Admitting: General Surgery

## 2016-06-15 ENCOUNTER — Encounter: Payer: Self-pay | Admitting: General Surgery

## 2016-06-15 VITALS — BP 121/77 | HR 54 | Temp 97.6°F | Ht 65.0 in | Wt 180.2 lb

## 2016-06-15 DIAGNOSIS — R1032 Left lower quadrant pain: Secondary | ICD-10-CM | POA: Diagnosis present

## 2016-06-15 DIAGNOSIS — Z4889 Encounter for other specified surgical aftercare: Secondary | ICD-10-CM

## 2016-06-15 LAB — CBC WITH DIFFERENTIAL/PLATELET
BASOS ABS: 0.1 10*3/uL (ref 0–0.1)
BASOS PCT: 1 %
EOS ABS: 0.1 10*3/uL (ref 0–0.7)
Eosinophils Relative: 1 %
HEMATOCRIT: 37 % (ref 35.0–47.0)
Hemoglobin: 12.8 g/dL (ref 12.0–16.0)
Lymphocytes Relative: 24 %
Lymphs Abs: 2 10*3/uL (ref 1.0–3.6)
MCH: 32.5 pg (ref 26.0–34.0)
MCHC: 34.6 g/dL (ref 32.0–36.0)
MCV: 93.9 fL (ref 80.0–100.0)
MONO ABS: 0.6 10*3/uL (ref 0.2–0.9)
Monocytes Relative: 7 %
NEUTROS ABS: 5.6 10*3/uL (ref 1.4–6.5)
NEUTROS PCT: 67 %
Platelets: 320 10*3/uL (ref 150–440)
RBC: 3.94 MIL/uL (ref 3.80–5.20)
RDW: 13.1 % (ref 11.5–14.5)
WBC: 8.3 10*3/uL (ref 3.6–11.0)

## 2016-06-15 MED ORDER — OXYCODONE HCL 5 MG PO TABS: 5.0000 mg | ORAL_TABLET | ORAL | 0 refills | Status: DC | PRN

## 2016-06-15 NOTE — Progress Notes (Signed)
Outpatient Surgical Follow Up  06/15/2016  Taylor Simmons is an 71 y.o. female.   Chief Complaint  Patient presents with  . Routine Post Op    Laparoscopic Appendectomy-06/04/16-Dr.Cooper    HPI: 71 year old female returns to clinic for follow-up from a laparoscopic appendectomy that was performed 11 days ago. She was discharged from the hospital last Wednesday. She continues to have left lower quadrant abdominal pain. She is eating well and having normal bowel function. She has had some nausea but reports no vomiting. The nausea was mostly brought on by usage of pain medication. The pain is only around her left lower quadrant incision site and does not radiate or involvement of the other incisions. She denies any fevers, chills, chest pain, shortness of breath.  Past Medical History:  Diagnosis Date  . Arm fracture, left   . Constipation    Followed by Dr. Tiffany Kocher  . Hip bursitis   . Hyperlipidemia   . Myalgia    from statin    Past Surgical History:  Procedure Laterality Date  . BREAST SURGERY     implants  . LAPAROSCOPIC APPENDECTOMY N/A 06/04/2016   Procedure: APPENDECTOMY LAPAROSCOPIC;  Surgeon: Florene Glen, MD;  Location: [facility] ORS;  Service: General;  Laterality: N/A;  . UPPER GASTROINTESTINAL ENDOSCOPY  2010   Dr. Tiffany Kocher    Family History  Problem Relation Age of Onset  . Hypertension Mother   . Bipolar disorder Sister   . Panic disorder Son     Social History:  reports that she quit smoking about 9 years ago. She has quit using smokeless tobacco. She reports that she drinks alcohol. She reports that she does not use drugs.  Allergies:  Allergies  Allergen Reactions  . Hydrocodone Other (See Comments) and Nausea Only    Medications reviewed.    ROS A multipoint review of systems was completed. All pertinent positives and negatives are documented within the history of present illness and remainder are negative.   BP 121/77   Pulse (!) 54   Temp  97.6 F (36.4 C) (Oral)   Ht 5\' 5"  (1.651 m)   Wt 81.7 kg (180 lb 3.2 oz)   BMI 29.99 kg/m   Physical Exam Gen.: No acute distress Chest: Clear to auscultation Heart: Regular rhythm Abdomen: Soft, nondistended. Well approximated laparoscopic incision sites without any evidence of erythema or drainage. Left lower quadrant site with resolving ecchymosis that is tender to palpation. The remainder of the abdomen is without tenderness.    No results found for this or any previous visit (from the past 48 hour(s)). No results found.  Assessment/Plan:  1. Aftercare following surgery 71 year old female 2 weeks status post laparoscopic appendectomy for ruptured appendicitis. Pathology reviewed with the patient.  2. Abdominal pain, left lower quadrant Patient with continued left lower quadrant abdominal pain with a history of ruptured appendicitis. Plan to repeat labs. Refill of pain medications provided today. She'll follow up with Dr. Burt Knack next week for additional wound check. - CBC with Differential     Clayburn Pert, MD [facility] General Surgeon  06/15/2016,3:59 PM

## 2016-06-15 NOTE — Patient Instructions (Signed)
We would like for you to go to the Lab today in the [place] of the hospital. We will call you with your results if abnormal, otherwise DR.Burt Knack will discuss the results at your visit next week. Please see your follow up appointment listed below.

## 2016-06-21 ENCOUNTER — Encounter: Payer: Medicare Other | Admitting: Surgery

## 2016-06-22 ENCOUNTER — Ambulatory Visit (INDEPENDENT_AMBULATORY_CARE_PROVIDER_SITE_OTHER): Payer: Medicare Other | Admitting: Surgery

## 2016-06-22 ENCOUNTER — Encounter: Payer: Self-pay | Admitting: Surgery

## 2016-06-22 VITALS — BP 129/79 | HR 61 | Temp 98.6°F | Ht 65.0 in | Wt 179.2 lb

## 2016-06-22 DIAGNOSIS — R1032 Left lower quadrant pain: Secondary | ICD-10-CM

## 2016-06-22 NOTE — Patient Instructions (Signed)
Please call our office with any questions or concerns.  Please do not submerge in a tub, hot tub, or pool until incisions are completely sealed.  Use sun block to incision area over the next year if this area will be exposed to sun. This helps decrease scarring.  You may resume your normal activities on 07/16/16. At that time- Listen to your body when lifting, if you have pain when lifting, stop and then try again in a few days. Pain after doing exercises or activities of daily living is normal as you get back in to your normal routine.  If you develop redness, drainage, or pain at incision sites- call our office immediately and speak with a nurse.

## 2016-06-22 NOTE — Progress Notes (Signed)
Outpatient postop visit  06/22/2016  Taylor Simmons is an 71 y.o. female.    Procedure: Laparoscopic appendectomy  CC: Minimal improving left lower quadrant port site pain  HPI: Patient had a ruptured appendix and is feeling much better today she is gradually improving. She is eating well and wants to go to the beach to [place]. She also is contemplating and has an appointment with a plastic surgeon for abdominoplasty.  Medications reviewed.    Physical Exam:  BP 129/79   Pulse 61   Temp 98.6 F (37 C) (Oral)   Ht 5\' 5"  (1.651 m)   Wt 179 lb 3.2 oz (81.3 kg)   BMI 29.82 kg/m     PE: Minimal resolving ecchymosis otherwise wounds are healing well no erythema no drainage nontender    Assessment/Plan:  Pathology is reviewed. Patient doing very well recommend follow up on an as-needed basis minor no heavy lifting.  Florene Glen, MD, FACS

## 2016-09-19 ENCOUNTER — Other Ambulatory Visit: Payer: Self-pay | Admitting: Family Medicine

## 2016-11-07 ENCOUNTER — Other Ambulatory Visit: Payer: Self-pay | Admitting: Infectious Diseases

## 2016-11-07 DIAGNOSIS — Z1231 Encounter for screening mammogram for malignant neoplasm of breast: Secondary | ICD-10-CM

## 2016-11-15 ENCOUNTER — Telehealth: Payer: Self-pay | Admitting: *Deleted

## 2016-11-15 DIAGNOSIS — Z87891 Personal history of nicotine dependence: Secondary | ICD-10-CM

## 2016-11-15 NOTE — Telephone Encounter (Signed)
Received referral for initial lung cancer screening scan. Contacted patient and obtained smoking history,(former, quit 08/08/07, 55 pack year) as well as answering questions related to screening process. Patient denies signs of lung cancer such as weight loss or hemoptysis. Patient denies comorbidity that would prevent curative treatment if lung cancer were found. Patient is scheduled for shared decision making visit and CT scan on 11/28/16.

## 2016-11-15 NOTE — Telephone Encounter (Signed)
Received referral for low dose lung cancer screening CT scan. Message left at phone number listed in EMR for patient to call me back to facilitate scheduling scan.  

## 2016-11-20 ENCOUNTER — Ambulatory Visit
Admission: RE | Admit: 2016-11-20 | Discharge: 2016-11-20 | Disposition: A | Discharge status: Home / Self Care | Payer: Medicare Other | Source: Ambulatory Visit | Attending: Infectious Diseases | Admitting: Infectious Diseases

## 2016-11-20 DIAGNOSIS — Z1231 Encounter for screening mammogram for malignant neoplasm of breast: Secondary | ICD-10-CM | POA: Insufficient documentation

## 2016-11-28 ENCOUNTER — Encounter: Payer: Self-pay | Admitting: Oncology

## 2016-11-28 ENCOUNTER — Ambulatory Visit
Admission: RE | Admit: 2016-11-28 | Discharge: 2016-11-28 | Disposition: A | Discharge status: Home / Self Care | Payer: Medicare Other | Source: Ambulatory Visit | Attending: Oncology | Admitting: Oncology

## 2016-11-28 ENCOUNTER — Inpatient Hospital Stay: Payer: Medicare Other | Attending: Oncology | Admitting: Oncology

## 2016-11-28 DIAGNOSIS — Z87891 Personal history of nicotine dependence: Secondary | ICD-10-CM | POA: Diagnosis not present

## 2016-11-28 DIAGNOSIS — I700 Atherosclerosis of aorta: Secondary | ICD-10-CM | POA: Diagnosis not present

## 2016-11-28 DIAGNOSIS — J439 Emphysema, unspecified: Secondary | ICD-10-CM | POA: Insufficient documentation

## 2016-11-28 DIAGNOSIS — Z122 Encounter for screening for malignant neoplasm of respiratory organs: Secondary | ICD-10-CM | POA: Diagnosis not present

## 2016-11-28 NOTE — Progress Notes (Signed)
In accordance with CMS guidelines, patient has met eligibility criteria including age, absence of signs or symptoms of lung cancer.  Social History  Substance Use Topics  . Smoking status: Former Smoker    Packs/day: 1.25    Years: 44.00    Quit date: 08/08/2007  . Smokeless tobacco: Former Systems developer  . Alcohol use Yes     Comment: occassionally     A shared decision-making session was conducted prior to the performance of CT scan. This includes one or more decision aids, includes benefits and harms of screening, follow-up diagnostic testing, over-diagnosis, false positive rate, and total radiation exposure.  Counseling on the importance of adherence to annual lung cancer LDCT screening, impact of co-morbidities, and ability or willingness to undergo diagnosis and treatment is imperative for compliance of the program.  Counseling on the importance of continued smoking cessation for former smokers; the importance of smoking cessation for current smokers, and information about tobacco cessation interventions have been given to patient including [place] and 1800 quit [place] programs.  Written order for lung cancer screening with LDCT has been given to the patient and any and all questions have been answered to the best of my abilities.   Yearly follow up will be coordinated by Burgess Estelle, Thoracic Navigator.  Faythe Casa, NP 11/28/2016 1:38 PM

## 2016-12-01 ENCOUNTER — Encounter: Payer: Self-pay | Admitting: *Deleted

## 2017-01-05 ENCOUNTER — Ambulatory Visit
Admission: RE | Admit: 2017-01-05 | Discharge: 2017-01-05 | Disposition: A | Discharge status: Home / Self Care | Payer: Medicare Other | Source: Ambulatory Visit | Attending: Infectious Diseases | Admitting: Infectious Diseases

## 2017-01-05 ENCOUNTER — Other Ambulatory Visit: Payer: Self-pay | Admitting: Infectious Diseases

## 2017-01-05 DIAGNOSIS — M7989 Other specified soft tissue disorders: Secondary | ICD-10-CM

## 2017-01-05 DIAGNOSIS — M79651 Pain in right thigh: Secondary | ICD-10-CM | POA: Diagnosis not present

## 2017-11-13 ENCOUNTER — Encounter: Payer: Self-pay | Admitting: *Deleted

## 2017-11-14 ENCOUNTER — Encounter
Admission: RE | Disposition: A | Discharge status: Home / Self Care | Payer: Self-pay | Source: Ambulatory Visit | Attending: Unknown Physician Specialty

## 2017-11-14 ENCOUNTER — Ambulatory Visit: Payer: Medicare Other | Admitting: Anesthesiology

## 2017-11-14 ENCOUNTER — Ambulatory Visit
Admission: RE | Admit: 2017-11-14 | Discharge: 2017-11-14 | Disposition: A | Discharge status: Home / Self Care | Payer: Medicare Other | Source: Ambulatory Visit | Attending: Unknown Physician Specialty | Admitting: Unknown Physician Specialty

## 2017-11-14 DIAGNOSIS — Z09 Encounter for follow-up examination after completed treatment for conditions other than malignant neoplasm: Secondary | ICD-10-CM | POA: Diagnosis not present

## 2017-11-14 DIAGNOSIS — E669 Obesity, unspecified: Secondary | ICD-10-CM | POA: Insufficient documentation

## 2017-11-14 DIAGNOSIS — Z86010 Personal history of colonic polyps: Secondary | ICD-10-CM | POA: Diagnosis not present

## 2017-11-14 DIAGNOSIS — E119 Type 2 diabetes mellitus without complications: Secondary | ICD-10-CM | POA: Insufficient documentation

## 2017-11-14 DIAGNOSIS — G4700 Insomnia, unspecified: Secondary | ICD-10-CM | POA: Insufficient documentation

## 2017-11-14 DIAGNOSIS — Z6834 Body mass index (BMI) 34.0-34.9, adult: Secondary | ICD-10-CM | POA: Insufficient documentation

## 2017-11-14 DIAGNOSIS — D123 Benign neoplasm of transverse colon: Secondary | ICD-10-CM | POA: Diagnosis not present

## 2017-11-14 DIAGNOSIS — Z87891 Personal history of nicotine dependence: Secondary | ICD-10-CM | POA: Diagnosis not present

## 2017-11-14 DIAGNOSIS — K219 Gastro-esophageal reflux disease without esophagitis: Secondary | ICD-10-CM | POA: Diagnosis not present

## 2017-11-14 DIAGNOSIS — Z8719 Personal history of other diseases of the digestive system: Secondary | ICD-10-CM | POA: Insufficient documentation

## 2017-11-14 DIAGNOSIS — K635 Polyp of colon: Secondary | ICD-10-CM | POA: Insufficient documentation

## 2017-11-14 DIAGNOSIS — K5730 Diverticulosis of large intestine without perforation or abscess without bleeding: Secondary | ICD-10-CM | POA: Insufficient documentation

## 2017-11-14 DIAGNOSIS — K760 Fatty (change of) liver, not elsewhere classified: Secondary | ICD-10-CM | POA: Diagnosis not present

## 2017-11-14 DIAGNOSIS — Z7951 Long term (current) use of inhaled steroids: Secondary | ICD-10-CM | POA: Insufficient documentation

## 2017-11-14 DIAGNOSIS — Z79899 Other long term (current) drug therapy: Secondary | ICD-10-CM | POA: Diagnosis not present

## 2017-11-14 DIAGNOSIS — K5909 Other constipation: Secondary | ICD-10-CM | POA: Insufficient documentation

## 2017-11-14 DIAGNOSIS — E7849 Other hyperlipidemia: Secondary | ICD-10-CM | POA: Diagnosis not present

## 2017-11-14 DIAGNOSIS — I2510 Atherosclerotic heart disease of native coronary artery without angina pectoris: Secondary | ICD-10-CM | POA: Insufficient documentation

## 2017-11-14 HISTORY — DX: Unspecified osteoarthritis, unspecified site: M19.90

## 2017-11-14 HISTORY — PX: COLONOSCOPY WITH PROPOFOL: SHX5780

## 2017-11-14 HISTORY — DX: Other specified disorders of bone density and structure, unspecified site: M85.80

## 2017-11-14 HISTORY — DX: Abnormal levels of other serum enzymes: R74.8

## 2017-11-14 HISTORY — DX: Nausea with vomiting, unspecified: R11.2

## 2017-11-14 HISTORY — DX: Tobacco use: Z72.0

## 2017-11-14 HISTORY — DX: Diverticulosis of intestine, part unspecified, without perforation or abscess without bleeding: K57.90

## 2017-11-14 HISTORY — PX: ESOPHAGOGASTRODUODENOSCOPY (EGD) WITH PROPOFOL: SHX5813

## 2017-11-14 HISTORY — DX: Atherosclerotic heart disease of native coronary artery without angina pectoris: I25.10

## 2017-11-14 HISTORY — DX: Other specified postprocedural states: Z98.890

## 2017-11-14 HISTORY — DX: Gastro-esophageal reflux disease without esophagitis: K21.9

## 2017-11-14 HISTORY — DX: Fatty (change of) liver, not elsewhere classified: K76.0

## 2017-11-14 SURGERY — ESOPHAGOGASTRODUODENOSCOPY (EGD) WITH PROPOFOL
Anesthesia: General

## 2017-11-14 MED ORDER — PROPOFOL 500 MG/50ML IV EMUL
INTRAVENOUS | Status: AC
  Filled 2017-11-14: qty 50

## 2017-11-14 MED ORDER — SODIUM CHLORIDE 0.9 % IV SOLN
INTRAVENOUS | Status: DC
  Administered 2017-11-14: 1000 mL via INTRAVENOUS
  Administered 2017-11-14: 14:00:00 via INTRAVENOUS

## 2017-11-14 MED ORDER — METOCLOPRAMIDE HCL 5 MG/ML IJ SOLN
INTRAMUSCULAR | Status: AC
  Filled 2017-11-14: qty 2

## 2017-11-14 MED ORDER — PROPOFOL 500 MG/50ML IV EMUL
INTRAVENOUS | Status: DC | PRN
  Administered 2017-11-14: 150 ug/kg/min via INTRAVENOUS

## 2017-11-14 MED ORDER — PROPOFOL 10 MG/ML IV BOLUS
INTRAVENOUS | Status: DC | PRN
  Administered 2017-11-14: 20 mg via INTRAVENOUS
  Administered 2017-11-14: 100 mg via INTRAVENOUS

## 2017-11-14 MED ORDER — LIDOCAINE 2% (20 MG/ML) 5 ML SYRINGE
INTRAMUSCULAR | Status: DC | PRN
  Administered 2017-11-14: 30 mg via INTRAVENOUS

## 2017-11-14 MED ORDER — LIDOCAINE HCL (PF) 2 % IJ SOLN
INTRAMUSCULAR | Status: AC
  Filled 2017-11-14: qty 10

## 2017-11-14 MED ORDER — FENTANYL CITRATE (PF) 100 MCG/2ML IJ SOLN
INTRAMUSCULAR | Status: AC
  Filled 2017-11-14: qty 2

## 2017-11-14 MED ORDER — METOCLOPRAMIDE HCL 5 MG/ML IJ SOLN
5.0000 mg | Freq: Once | INTRAMUSCULAR | Status: AC
  Administered 2017-11-14: 5 mg via INTRAVENOUS

## 2017-11-14 MED ORDER — FENTANYL CITRATE (PF) 100 MCG/2ML IJ SOLN
INTRAMUSCULAR | Status: DC | PRN
  Administered 2017-11-14 (×2): 50 ug via INTRAVENOUS

## 2017-11-14 NOTE — Anesthesia Post-op Follow-up Note (Signed)
Anesthesia QCDR form completed.        

## 2017-11-14 NOTE — Op Note (Signed)
[facility] Patient Name: Taylor Simmons Procedure Date: 11/14/2017 1:38 PM MRN: [id number] Account #: [id number] Date of Birth: 27-Mar-1946 Admit Type: Outpatient Age: 72 Room: [facility] ENDO ROOM 2 Gender: Female Note Status: Finalized Procedure:            Upper GI endoscopy Indications:          Follow-up of Barrett's esophagus Providers:            Manya Silvas, MD Referring MD:         Ramonita Lab, MD (Referring MD) Medicines:            Propofol per Anesthesia Complications:        No immediate complications. Procedure:            Pre-Anesthesia Assessment:                       - After reviewing the risks and benefits, the patient                        was deemed in satisfactory condition to undergo the                        procedure.                       After obtaining informed consent, the endoscope was                        passed under direct vision. Throughout the procedure,                        the patient's blood pressure, pulse, and oxygen                        saturations were monitored continuously. The Endoscope                        was introduced through the mouth, and advanced to the                        second part of duodenum. The upper GI endoscopy was                        accomplished without difficulty. The patient tolerated                        the procedure well. Findings:      There were esophageal mucosal changes suspicious for short-segment       Barrett's esophagus present at the gastroesophageal junction. The       maximum longitudinal extent of these mucosal changes was 1 cm in length.       Mucosa was biopsied with a cold forceps for histology. One specimen       bottle was sent to pathology. GEJ at 40cm.      The stomach was normal.      The examined duodenum was normal. Impression:           - Esophageal mucosal changes suspicious for                        short-segment Barrett's esophagus.  Biopsied.                       - Normal stomach.                       - Normal examined duodenum. Recommendation:       - Await pathology results. Manya Silvas, MD 11/14/2017 2:34:09 PM This report has been signed electronically. Number of Addenda: 0 Note Initiated On: 11/14/2017 1:38 PM      [facility]

## 2017-11-14 NOTE — Op Note (Signed)
[facility] Gastroenterology Patient Name: Taylor Simmons Procedure Date: 11/14/2017 1:37 PM MRN: [id number] Account #: [id number] Date of Birth: April 13, 1946 Admit Type: Outpatient Age: 72 Room: [facility] ENDO ROOM 2 Gender: Female Note Status: Finalized Procedure:            Colonoscopy Indications:          High risk colon cancer surveillance: Personal history                        of colonic polyps Providers:            Manya Silvas, MD Referring MD:         Ramonita Lab, MD (Referring MD) Medicines:            Propofol per Anesthesia Complications:        No immediate complications. Procedure:            Pre-Anesthesia Assessment:                       - After reviewing the risks and benefits, the patient                        was deemed in satisfactory condition to undergo the                        procedure.                       After obtaining informed consent, the colonoscope was                        passed under direct vision. Throughout the procedure,                        the patient's blood pressure, pulse, and oxygen                        saturations were monitored continuously. The                        Colonoscope was introduced through the anus and                        advanced to the the cecum, identified by appendiceal                        orifice and ileocecal valve. The colonoscopy was                        performed without difficulty. The colonoscopy was                        performed with moderate difficulty due to multiple                        diverticula in the colon. Successful completion of the                        procedure was aided by applying abdominal pressure. The  patient tolerated the procedure well. The quality of                        the bowel preparation was excellent. Findings:      There were very many diverticuli in the left colon making passage very       difficult but was able  to advance despite this      A small polyp was found in the hepatic flexure. The polyp was sessile.       The polyp was removed with a hot snare. Resection and retrieval were       complete.      A diminutive polyp was found in the distal transverse colon. The polyp       was sessile. The polyp was removed with a jumbo cold forceps. Resection       and retrieval were complete. Impression:           - One small polyp at the hepatic flexure, removed with                        a hot snare. Resected and retrieved.                       - One diminutive polyp in the distal transverse colon,                        removed with a jumbo cold forceps. Resected and                        retrieved. Recommendation:       - Await pathology results. Manya Silvas, MD 11/14/2017 3:09:02 PM This report has been signed electronically. Number of Addenda: 0 Note Initiated On: 11/14/2017 1:37 PM Scope Withdrawal Time: 0 hours 15 minutes 44 seconds  Total Procedure Duration: 0 hours 25 minutes 43 seconds       [facility]

## 2017-11-14 NOTE — Anesthesia Preprocedure Evaluation (Signed)
Anesthesia Evaluation  Patient identified by MRN, date of birth, ID band Patient awake    Reviewed: Allergy & Precautions, H&P , NPO status , reviewed documented beta blocker date and time   History of Anesthesia Complications (+) PONV  Airway Mallampati: III  TM Distance: >3 FB Neck ROM: full    Dental  (+) Implants, Chipped   Pulmonary former smoker,    Pulmonary exam normal        Cardiovascular + CAD  Normal cardiovascular exam     Neuro/Psych    GI/Hepatic GERD  ,  Endo/Other  diabetes  Renal/GU      Musculoskeletal  (+) Arthritis ,   Abdominal   Peds  Hematology   Anesthesia Other Findings Past Medical History: No date: Arm fracture, left No date: Arthritis No date: Constipation     Comment:  Followed by Dr. Tiffany Kocher No date: Coronary artery disease No date: Diabetes mellitus without complication (HCC) No date: Diverticulosis No date: Elevated liver enzymes No date: Fatty liver No date: GERD (gastroesophageal reflux disease)     Comment:  history of barrett's esophagus No date: Hip bursitis No date: Hyperlipidemia No date: Myalgia     Comment:  from statin No date: Osteopenia No date: PONV (postoperative nausea and vomiting) No date: Tobacco abuse  Past Surgical History: 1978: AUGMENTATION MAMMAPLASTY; Bilateral No date: BREAST SURGERY     Comment:  implants No date: COLONOSCOPY W/ POLYPECTOMY No date: ESOPHAGOGASTRODUODENOSCOPY (EGD) WITH ESOPHAGEAL DILATION 06/04/2016: LAPAROSCOPIC APPENDECTOMY; N/A     Comment:  Procedure: APPENDECTOMY LAPAROSCOPIC;  Surgeon: Florene Glen, MD;  Location: [facility] ORS;  Service: General;                Laterality: N/A; 2010: UPPER GASTROINTESTINAL ENDOSCOPY     Comment:  Dr. Tiffany Kocher  BMI    Body Mass Index:  34.66 kg/m      Reproductive/Obstetrics                             Anesthesia Physical Anesthesia  Plan  ASA: III  Anesthesia Plan: General   Post-op Pain Management:    Induction:   PONV Risk Score and Plan: 4 or greater and Treatment may vary due to age or medical condition, TIVA, Ondansetron and Metaclopromide  Airway Management Planned:   Additional Equipment:   Intra-op Plan:   Post-operative Plan:   Informed Consent: I have reviewed the patients History and Physical, chart, labs and discussed the procedure including the risks, benefits and alternatives for the proposed anesthesia with the patient or authorized representative who has indicated his/her understanding and acceptance.   Dental Advisory Given  Plan Discussed with: CRNA  Anesthesia Plan Comments:         Anesthesia Quick Evaluation

## 2017-11-14 NOTE — Transfer of Care (Signed)
Immediate Anesthesia Transfer of Care Note  Patient: Taylor Simmons  Procedure(s) Performed: ESOPHAGOGASTRODUODENOSCOPY (EGD) WITH PROPOFOL (N/A ) COLONOSCOPY WITH PROPOFOL (N/A )  Patient Location: PACU and Endoscopy Unit  Anesthesia Type:General  Level of Consciousness: awake  Airway & Oxygen Therapy: Patient Spontanous Breathing and Patient connected to nasal cannula oxygen  Post-op Assessment: Report given to RN and Post -op Vital signs reviewed and stable  Post vital signs: Reviewed and stable  Last Vitals:  Vitals Value Taken Time  BP 126/60 11/14/2017  3:08 PM  Temp 35.8 C 11/14/2017  3:08 PM  Pulse 65 11/14/2017  3:11 PM  Resp 15 11/14/2017  3:11 PM  SpO2 100 % 11/14/2017  3:11 PM  Vitals shown include unvalidated device data.  Last Pain:  Vitals:   11/14/17 1508  TempSrc: Tympanic  PainSc: 0-No pain      Patients Stated Pain Goal: 0 (07/68/08 8110)  Complications: No apparent anesthesia complications

## 2017-11-15 ENCOUNTER — Encounter: Payer: Self-pay | Admitting: Unknown Physician Specialty

## 2017-11-15 NOTE — Anesthesia Postprocedure Evaluation (Signed)
Anesthesia Post Note  Patient: Taylor Simmons  Procedure(s) Performed: ESOPHAGOGASTRODUODENOSCOPY (EGD) WITH PROPOFOL (N/A ) COLONOSCOPY WITH PROPOFOL (N/A )  Patient location during evaluation: Endoscopy Anesthesia Type: General Level of consciousness: awake and alert Pain management: pain level controlled Vital Signs Assessment: post-procedure vital signs reviewed and stable Respiratory status: spontaneous breathing, nonlabored ventilation and respiratory function stable Cardiovascular status: blood pressure returned to baseline and stable Postop Assessment: no apparent nausea or vomiting Anesthetic complications: no     Last Vitals:  Vitals:   11/14/17 1518 11/14/17 1528  BP: 115/72 (!) 140/57  Pulse: (!) 58 (!) 55  Resp: 20 14  Temp:    SpO2: 99% 100%    Last Pain:  Vitals:   11/14/17 1528  TempSrc:   PainSc: 0-No pain                 Alphonsus Sias

## 2017-11-16 LAB — SURGICAL PATHOLOGY

## 2017-11-29 ENCOUNTER — Telehealth: Payer: Self-pay | Admitting: *Deleted

## 2017-11-29 NOTE — Telephone Encounter (Signed)
Patient previously reported that she is not interested in continuing lung screening. Contacted patient and discussed screening. She would like to consider having lung screening in 6 months.

## 2018-06-17 ENCOUNTER — Telehealth: Payer: Self-pay | Admitting: *Deleted

## 2018-06-17 DIAGNOSIS — Z87891 Personal history of nicotine dependence: Secondary | ICD-10-CM

## 2018-06-17 DIAGNOSIS — Z122 Encounter for screening for malignant neoplasm of respiratory organs: Secondary | ICD-10-CM

## 2018-06-17 NOTE — Telephone Encounter (Signed)
Patient has been notified that annual lung cancer screening low dose CT scan is due currently or will be in near future. Confirmed that patient is within the age range of 55-77, and asymptomatic, (no signs or symptoms of lung cancer). Patient denies illness that would prevent curative treatment for lung cancer if found. Verified smoking history, (fomer, quit 08/08/07, 55 pack year). The shared decision making visit was done 11/28/16. Patient is agreeable for CT scan being scheduled.

## 2018-06-18 ENCOUNTER — Ambulatory Visit
Admission: RE | Admit: 2018-06-18 | Discharge: 2018-06-18 | Disposition: A | Discharge status: Home / Self Care | Payer: Medicare Other | Source: Ambulatory Visit | Attending: Nurse Practitioner | Admitting: Nurse Practitioner

## 2018-06-18 DIAGNOSIS — Z87891 Personal history of nicotine dependence: Secondary | ICD-10-CM

## 2018-06-18 DIAGNOSIS — Z122 Encounter for screening for malignant neoplasm of respiratory organs: Secondary | ICD-10-CM | POA: Insufficient documentation

## 2018-06-19 ENCOUNTER — Telehealth: Payer: Self-pay | Admitting: *Deleted

## 2018-06-19 ENCOUNTER — Encounter: Payer: Self-pay | Admitting: *Deleted

## 2018-06-19 NOTE — Telephone Encounter (Signed)
Notified patient of LDCT lung cancer screening program results with recommendation for 12  month follow up imaging.  Also notified of incidental findings noted below and is encouraged to discuss further questions with PCP who will receive a copy of this not and/or the CT reports.  Patient verbalized understanding.     IMPRESSION: 1. Lung-RADS 1, negative. Continue annual screening with low-dose chest CT without contrast in 12 months. 2. 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 Full evaluation is limited by technique. 3. Aortic atherosclerosis (ICD10-170.0). Coronary artery calcification. 4.  Emphysema (ICD10-J43.9).

## 2018-06-25 ENCOUNTER — Encounter: Payer: Self-pay | Admitting: *Deleted

## 2018-06-28 ENCOUNTER — Encounter: Payer: Self-pay | Admitting: *Deleted

## 2018-07-02 DIAGNOSIS — C4491 Basal cell carcinoma of skin, unspecified: Secondary | ICD-10-CM

## 2018-07-02 HISTORY — DX: Basal cell carcinoma of skin, unspecified: C44.91

## 2018-07-23 ENCOUNTER — Telehealth: Payer: Self-pay | Admitting: *Deleted

## 2018-07-23 NOTE — Telephone Encounter (Signed)
Patient calling to cancel appointment for tomorrow. She is out of town at [employer] at this time and would prefer to not come due to the coronovirus. Prescreen done and routed to pool.      Cardiac Questionnaire:    Since your last visit or hospitalization:    1. Have you been having new or worsening chest pain? NO   2. Have you been having new or worsening shortness of breath? NO 3. Have you been having new or worsening leg swelling, wt gain, or increase in abdominal girth (pants fitting more tightly)? NO   4. Have you had any passing out spells? NO    *A YES to any of these questions would result in the appointment being kept. *If all the answers to these questions are NO, we should indicate that given the current situation regarding the worldwide coronarvirus pandemic, at the recommendation of the CDC, we are looking to limit gatherings in our waiting area, and thus will reschedule their appointment beyond four weeks from today.   _____________      Primary Cardiologist:  DR Youngwood  Patient contacted.  History reviewed.  No symptoms to suggest any unstable cardiac conditions.  Based on discussion, with current pandemic situation, we will be postponing this appointment for Taylor Simmons with a plan for f/u in 6-12 wks or sooner if feasible/necessary.  If symptoms change, she has been instructed to contact our office.   Routing to C19 CANCEL pool for tracking (P CV DIV CV19 CANCEL - reason for visit "other.") and assigning priority (1 = 4-6 wks, 2 = 6-12 wks, 3 = >12 wks).   Roney Jaffe, RN  07/23/2018 11:01 AM         .

## 2018-07-24 ENCOUNTER — Ambulatory Visit: Payer: Medicare Other | Admitting: Internal Medicine

## 2018-08-22 NOTE — Telephone Encounter (Signed)
Sending to scheduling pool and removing from the C19 cancel pool. 

## 2018-08-26 ENCOUNTER — Telehealth: Payer: Self-pay | Admitting: Internal Medicine

## 2018-08-27 NOTE — Progress Notes (Signed)
Virtual Visit via Video Note   This visit type was conducted due to national recommendations for restrictions regarding the COVID-19 Pandemic (e.g. social distancing) in an effort to limit this patient's exposure and mitigate transmission in our community.  Due to her co-morbid illnesses, this patient is at least at moderate risk for complications without adequate follow up.  This format is felt to be most appropriate for this patient at this time.  All issues noted in this document were discussed and addressed.  A limited physical exam was performed with this format.  Please refer to the patient's chart for her consent to telehealth for Taylor Simmons.   Evaluation Performed: New visit  Date:  08/29/2018   ID:  Taylor, Simmons [birth year], MRN [id number]  Patient Location: Home Provider Location: Home  PCP:  Taylor Hector, MD  Cardiologist: Taylor Backers, MD Electrophysiologist:  None   Chief Complaint:  Chest pain and abnormal stress test  History of Present Illness:    Taylor Simmons is a 73 y.o. female with history of coronary artery disease (abnormal stress test), hyperlipidemia, type 2 diabetes mellitus, GERD, fatty liver, and tobacco use, whom we have been asked to see by Taylor Simmons for further evaluation of chest pain.  She was previously seen by Taylor Simmons (most recently 06/26/2018), at which time she reported slight improvement in her chest discomfort with isosorbide mononitrate, though it continued to occur on a daily basis.  She ultimately decided to discontinue isosorbide mononitrate due to persistent headache. No further testing was recommended by Taylor Simmons.  However, Taylor Simmons and Taylor Simmons remain concerned about her chest pain and abnormal stress test (see details below) and would like a second opinion.  Taylor Simmons reports ~5 months of intermittent substernal chest discomfort.  It Simmons and goes and is not exertional or associated with  other specific activities (though it seems to be somewhat related to stressful situations).  At times it is sharp and lasts only a few seconds.  However, at other times it is more pressure-like and lasts several minutes.  Maximal intensity is 5/10.  She began taking aspirin 81 mg daily and feels like this has decreased the frequency and intensity of the pain.  The most recent episode was yesterday, lasting only a few seconds.  It was suggested that her discomfort may be due to GERD, though Taylor Simmons feels like this chest pain is different than what she has experienced in the past with acid reflux.  Taylor Simmons denies a history of prior cardiac disease.  She has not experienced shortness of breath, orthopnea, edema, palpitations, or lightheadedness.  The patient does not have symptoms concerning for COVID-19 infection (fever, chills, cough, or new shortness of breath).    Past Medical History:  Diagnosis Date   Arm fracture, left    Arthritis    Borderline diabetes mellitus    Constipation    Followed by Taylor Simmons   Coronary artery disease    Diverticulosis    Elevated liver enzymes    Fatty liver    GERD (gastroesophageal reflux disease)    history of barrett's esophagus   Hip bursitis    Hyperlipidemia    Myalgia    from statin   Osteopenia    PONV (postoperative nausea and vomiting)    Tobacco abuse    Past Surgical History:  Procedure Laterality Date   AUGMENTATION MAMMAPLASTY Bilateral 1978   BREAST SURGERY  implants   COLONOSCOPY W/ POLYPECTOMY     COLONOSCOPY WITH PROPOFOL N/A 11/14/2017   Procedure: COLONOSCOPY WITH PROPOFOL;  Surgeon: Taylor Silvas, MD;  Location: [facility] ENDOSCOPY;  Service: Endoscopy;  Laterality: N/A;   ESOPHAGOGASTRODUODENOSCOPY (EGD) WITH ESOPHAGEAL DILATION     ESOPHAGOGASTRODUODENOSCOPY (EGD) WITH PROPOFOL N/A 11/14/2017   Procedure: ESOPHAGOGASTRODUODENOSCOPY (EGD) WITH PROPOFOL;  Surgeon: Taylor Silvas, MD;   Location: [facility] ENDOSCOPY;  Service: Endoscopy;  Laterality: N/A;   LAPAROSCOPIC APPENDECTOMY N/A 06/04/2016   Procedure: APPENDECTOMY LAPAROSCOPIC;  Surgeon: Taylor Glen, MD;  Location: [facility] ORS;  Service: General;  Laterality: N/A;   UPPER GASTROINTESTINAL ENDOSCOPY  2010   Taylor Simmons     Current Meds  Medication Sig   aspirin 81 MG chewable tablet Chew 81 mg by mouth daily.   atorvastatin (LIPITOR) 20 MG tablet Take 1 tablet by mouth daily.   clonazePAM (KLONOPIN) 0.5 MG tablet Take 1 tablet by mouth as needed.    DEXILANT 60 MG capsule Take 1 capsule by mouth daily.    fluticasone (FLONASE) 50 MCG/ACT nasal spray Place 2 sprays into the nose as needed.   hydrochlorothiazide (HYDRODIURIL) 25 MG tablet Take 1 tablet by mouth daily.   ibuprofen (ADVIL,MOTRIN) 600 MG tablet Take 1 tablet (600 mg total) by mouth every 8 (eight) hours as needed for fever or mild pain.   LINZESS 290 MCG CAPS capsule Take 1 capsule by mouth at bedtime.   MAGNESIUM PO Take 500 mg by mouth daily.    sertraline (ZOLOFT) 50 MG tablet Take 1 tablet by mouth daily.   traZODone (DESYREL) 50 MG tablet Take 1-2 tablets by mouth at bedtime as needed.     Allergies:   Hydrocodone   Social History   Tobacco Use   Smoking status: Former Smoker    Packs/day: 1.25    Years: 44.00    Pack years: 55.00    Types: Cigarettes    Last attempt to quit: 08/08/2007    Years since quitting: 11.0   Smokeless tobacco: Former Systems developer  Substance Use Topics   Alcohol use: Yes    Alcohol/week: 4.0 standard drinks    Types: 2 Glasses of wine, 2 Cans of beer per week   Drug use: No     Family Hx: The patient's family history includes Bipolar disorder in her sister; Hypertension in her mother; Panic disorder in her son. There is no history of Breast cancer.  ROS:   Please see the history of present illness.   All other systems reviewed and are negative.   Prior CV studies:   The following studies were  reviewed today:  Exercise stress echocardiogram (05/20/2018, [facility]): Normal baseline LV function with trivial regurgitation of all valves.  Poor exercise capacity (1:54 minutes) with anterior and apical hypokinesis on stress images.  Labs/Other Tests and Data Reviewed:    EKG:  EKG report from 06/26/2018 at [facility] indicates NSR with non-specific ST/T changes (tracing not available for personal review).  Recent Labs: No results found for requested labs within last 8760 hours.   Recent Lipid Panel No results found for: CHOL, TRIG, HDL, CHOLHDL, LDLCALC, LDLDIRECT  Wt Readings from Last 3 Encounters:  08/29/18 193 lb (87.5 kg)  06/18/18 191 lb (86.6 kg)  11/14/17 183 lb 7 oz (83.2 kg)     Objective:    Vital Signs:  BP 132/83 (BP Location: Left Arm, Patient Position: Sitting, Cuff Size: Normal)    Ht 5\' 5"  (1.651 m)  Wt 193 lb (87.5 kg)    BMI 32.12 kg/m    VITAL SIGNS:  reviewed GEN:  no acute distress RESPIRATORY:  normal respiratory effort, symmetric expansion  ASSESSMENT & PLAN:    Chest pain and abnormal stress test: Overall, the quality and timing of Taylor Simmons's chest pain is atypical for coronary insufficiency.  However, her stress echo in January was abnormal, with stress-induced anterior/apical wall motion abnormality suggestive of LAD ischemia.  Taylor Simmons has several cardiac risk factors, including borderline diabetes, hyperlipidemia, obesity, age > 38, and prior tobacco use.  We have discussed options for further evaluation, including repeat stress test, cardiac CTA, and cardiac catheterization.  Given that her symptoms have improved over the last 2 months, I do not believe that urgent catheterization is needed.  Taylor Simmons would like to proceed with cardiac CTA.  I will add metoprolol tartrate 12.5 mg BID for antianginal therapy as well as to lower her heart rate in anticipation of cardiac CTA.  She should continue aspirin 81 mg daily and let us  know if symptoms worsen in the meantime.  Hyperlipidemia: Continue atorvastatin 20 mg daily for now.  If there is evidence of coronary artery disease on CTA, escalation of atorvastatin for goal LDL < 70 will need to be considered (LDL 86 on 06/03/2018).  COVID-19 Education: The signs and symptoms of COVID-19 were discussed with the patient and how to seek care for testing (follow up with PCP or arrange E-visit).  The importance of social distancing was discussed today.  Time:   Today, I have spent 25 minutes with the patient with telehealth technology discussing the above problems.  An additional 15 minutes were spent reviewing the patient's chart and documenting today's encounter.   Medication Adjustments/Labs and Tests Ordered: Current medicines are reviewed at length with the patient today.  Concerns regarding medicines are outlined above.   Tests Ordered: Orders Placed This Encounter  Procedures   CT CORONARY MORPH W/CTA COR W/SCORE W/CA W/CM &/OR WO/CM   CT CORONARY FRACTIONAL FLOW RESERVE DATA PREP   CT CORONARY FRACTIONAL FLOW RESERVE FLUID ANALYSIS   Basic metabolic panel    Medication Changes: Meds ordered this encounter  Medications   metoprolol tartrate (LOPRESSOR) 25 MG tablet    Sig: Take 0.5 tablets (12.5 mg total) by mouth 2 (two) times daily.    Dispense:  90 tablet    Refill:  2    Disposition:  Follow up in 3 month(s)  Signed, Nelva Bush, MD  08/29/2018 9:36 AM    [facility]

## 2018-08-29 ENCOUNTER — Telehealth (INDEPENDENT_AMBULATORY_CARE_PROVIDER_SITE_OTHER): Payer: Medicare Other | Admitting: Internal Medicine

## 2018-08-29 ENCOUNTER — Other Ambulatory Visit: Payer: Self-pay

## 2018-08-29 ENCOUNTER — Encounter: Payer: Self-pay | Admitting: Internal Medicine

## 2018-08-29 ENCOUNTER — Telehealth: Payer: Self-pay | Admitting: *Deleted

## 2018-08-29 VITALS — BP 132/83 | Ht 65.0 in | Wt 193.0 lb

## 2018-08-29 DIAGNOSIS — R079 Chest pain, unspecified: Secondary | ICD-10-CM

## 2018-08-29 DIAGNOSIS — E785 Hyperlipidemia, unspecified: Secondary | ICD-10-CM | POA: Diagnosis not present

## 2018-08-29 DIAGNOSIS — R9439 Abnormal result of other cardiovascular function study: Secondary | ICD-10-CM

## 2018-08-29 DIAGNOSIS — Z01810 Encounter for preprocedural cardiovascular examination: Secondary | ICD-10-CM | POA: Diagnosis not present

## 2018-08-29 MED ORDER — METOPROLOL TARTRATE 25 MG PO TABS: 12.5000 mg | ORAL_TABLET | Freq: Two times a day (BID) | ORAL | 2 refills | Status: DC

## 2018-08-29 NOTE — Telephone Encounter (Signed)
Attempted to reach patient to go over AVS from office visit today and to discuss instructions.  No answer. Left message to call back if she would like to discuss and that we would be mailing her the AVS as well to review.

## 2018-08-29 NOTE — Patient Instructions (Addendum)
Medication Instructions:  Your physician has recommended you make the following change in your medication:  1- START Metoprolol tartrate 12.5 mg  (0.5 tablet) by mouth two times a day.  If you need a refill on your cardiac medications before your next appointment, please call your pharmacy.   Lab work: Your physician recommends that you return for lab work [facility].   (BMET) - You will need to wait until the CT is scheduled to get the lab work. As we do not know when the CT will be at this time.  - Once someone calls you to schedule,  Please go to the [facility]. You will check in at the front desk to the right as you walk into the atrium. Valet Parking is offered if needed.   If you have labs (blood work) drawn today and your tests are completely normal, you will receive your results only by: Marland Kitchen MyChart Message (if you have MyChart) OR . A paper copy in the mail If you have any lab test that is abnormal or we need to change your treatment, we will call you to review the results.  Testing/Procedures: Your physician has requested that you have cardiac CT. Cardiac computed tomography (CT) is a painless test that uses an x-ray machine to take clear, detailed pictures of your heart. For further information please visit [email or web address][employer][email or web address].    Please arrive at the [facility] main entrance of [facility] at _______  AM (30-45 minutes prior to test start time)  [facility] [place], [place] [address] [phone]  Proceed to the [facility] Radiology Department (First Floor).  Please follow these instructions carefully (unless otherwise directed):   On the Night Before the Test: . Be sure to Drink plenty of water. . Do not consume any caffeinated/decaffeinated beverages or chocolate 12 hours prior to your test. . Do not take any antihistamines 12 hours prior to your test.  On the Day of the Test: . Drink plenty of water.  Do not drink any water within one hour of the test. . Do not eat any food 4 hours prior to the test. . You may take your regular medications prior to the test.  . Take metoprolol (Lopressor) two hours prior to test. . HOLD Furosemide/Hydrochlorothiazide morning of the test.       After the Test: . Drink plenty of water. . After receiving IV contrast, you may experience a mild flushed feeling. This is normal. . On occasion, you may experience a mild rash up to 24 hours after the test. This is not dangerous. If this occurs, you can take Benadryl 25 mg and increase your fluid intake. . If you experience trouble breathing, this can be serious. If it is severe call 911 IMMEDIATELY. If it is mild, please call our office.   Follow-Up: At [facility], you and your health needs are our priority.  As part of our continuing mission to provide you with exceptional heart care, we have created designated Provider Care Teams.  These Care Teams include your primary Cardiologist (physician) and Advanced Practice Providers (APPs -  Physician Assistants and Nurse Practitioners) who all work together to provide you with the care you need, when you need it. You will need a follow up appointment in 3 months.  Please call our office 2 months in advance to schedule this appointment.  You may see DR Harrell Gave END or one of the following Advanced  Practice Providers on your designated Care Team:   Murray Hodgkins, NP Christell Faith, PA-C . Marrianne Mood, PA-C

## 2018-08-29 NOTE — Telephone Encounter (Signed)
Patient calling back.  We went over the AVS, including pre-procedural instructions and process for obtaining lab work prior to the CT. She is aware it may take awhile to schedule this.  She is currently at [facility] and asked me to send her prescription there. Rx sent. She verbalized understanding and was very Patent attorney.

## 2018-08-29 NOTE — Addendum Note (Signed)
Addended by: Vanessa Ralphs on: 08/29/2018 11:24 AM   Modules accepted: Orders

## 2018-09-12 ENCOUNTER — Telehealth (HOSPITAL_COMMUNITY): Payer: Self-pay | Admitting: Emergency Medicine

## 2018-09-12 NOTE — Telephone Encounter (Signed)
Called to review cardiac CT instructions for appt on 09/13/18 at 1:30p, pt was not aware of appt made and wishes to reschedule.  New appt 09/30/18 at 8:30

## 2018-09-13 ENCOUNTER — Ambulatory Visit (HOSPITAL_COMMUNITY): Payer: Medicare Other

## 2018-09-20 ENCOUNTER — Telehealth: Payer: Self-pay | Admitting: Internal Medicine

## 2018-09-20 NOTE — Telephone Encounter (Signed)
I agree that symptoms and time course are not consistent with side effect from metoprolol.  Given that she is on low dose metoprolol, she could stop the medication temporarily if she likes.  She should also speak with her PCP or [place] for [place] if symptoms persist.  She should try to drink plenty of clear liquids to prevent getting dehydrated.  Nelva Bush, MD [facility] Pager: [phone]

## 2018-09-20 NOTE — Telephone Encounter (Signed)
Returned call to patient with advice from Dr. Saunders Revel. She verbalized understanding and agreeable to POC.   She will return call with update.   Advised pt to call for any further questions or concerns.

## 2018-09-20 NOTE — Telephone Encounter (Signed)
I spoke with the patient.  She is aware of Dr. Darnelle Bos recommendations.   She states she has a call in to Dr. Olin Pia office (PCP) as well and is waiting on a call back.  I advised her if she stops metoprolol and Dr. Caryl Comes also gives her something to help with her symptoms, she may have a hard time knowing if the metoprolol was the culprit. She is aware just to keep that in mind.   The patient voices understanding and is agreeable.

## 2018-09-20 NOTE — Telephone Encounter (Signed)
Pt c/o medication issue:  1. Name of Medication:metoprolol   2. How are you currently taking this medication (dosage and times per day)? 12.5 po BID  3. Are you having a reaction (difficulty breathing--STAT)? Nausea vomiting diarrhea dizziness  4. What is your medication issue? Patient thinks side effect please call

## 2018-09-20 NOTE — Telephone Encounter (Signed)
Call to patient, she reports starting on metoprolol 5/2. No issues until 4 days ago when she began having nausea, dec appetite, vomiting and diarrhea. Denies fever, cough, chills, weight gain, cxp.  [place] and [place] to take VS.  Pt believes sx may be r/t medication. I told pt, I felt it was unlikely given several weeks of no sx.   I asked pt to consult PCP for sx.   Routed to provider to advise.

## 2018-09-27 ENCOUNTER — Telehealth (HOSPITAL_COMMUNITY): Payer: Self-pay | Admitting: Emergency Medicine

## 2018-09-27 NOTE — Telephone Encounter (Signed)
Reaching out to patient to offer assistance regarding upcoming cardiac imaging study; pt verbalizes understanding of appt date/time, parking situation and where to check in, pre-test NPO status and medications ordered, and verified current allergies; name and call back number provided for further questions should they arise Marchia Bond RN [facility] [place] [facility] [phone] office [phone] cell   Pt states she is still taking the metoprolol and feels okay. I requested she get her labs drawn at [place] as Dr End instructed however she is out of town and coming home over weekend for [employer]. -- will NEED ISTAT CREAT Pt denies covid symptoms,  Verbalized understanding of visitor policy

## 2018-09-30 ENCOUNTER — Other Ambulatory Visit: Payer: Self-pay

## 2018-09-30 ENCOUNTER — Ambulatory Visit (HOSPITAL_COMMUNITY)
Admission: RE | Admit: 2018-09-30 | Discharge: 2018-09-30 | Disposition: A | Discharge status: Home / Self Care | Payer: Medicare Other | Source: Ambulatory Visit | Attending: Internal Medicine | Admitting: Internal Medicine

## 2018-09-30 DIAGNOSIS — R9439 Abnormal result of other cardiovascular function study: Secondary | ICD-10-CM | POA: Diagnosis not present

## 2018-09-30 DIAGNOSIS — R079 Chest pain, unspecified: Secondary | ICD-10-CM

## 2018-09-30 DIAGNOSIS — I2510 Atherosclerotic heart disease of native coronary artery without angina pectoris: Secondary | ICD-10-CM | POA: Diagnosis not present

## 2018-09-30 DIAGNOSIS — R9430 Abnormal result of cardiovascular function study, unspecified: Secondary | ICD-10-CM

## 2018-09-30 LAB — POCT I-STAT CREATININE: Creatinine, Ser: 0.8 mg/dL (ref 0.44–1.00)

## 2018-09-30 MED ORDER — NITROGLYCERIN 0.4 MG SL SUBL
0.8000 mg | SUBLINGUAL_TABLET | Freq: Once | SUBLINGUAL | Status: AC
  Administered 2018-09-30: 0.8 mg via SUBLINGUAL
  Filled 2018-09-30: qty 25

## 2018-09-30 MED ORDER — NITROGLYCERIN 0.4 MG SL SUBL
SUBLINGUAL_TABLET | SUBLINGUAL | Status: AC
  Filled 2018-09-30: qty 2

## 2018-09-30 MED ORDER — IOHEXOL 350 MG/ML SOLN
100.0000 mL | Freq: Once | INTRAVENOUS | Status: AC | PRN
  Administered 2018-09-30: 100 mL via INTRAVENOUS

## 2018-09-30 NOTE — Progress Notes (Signed)
CT scan completed. Tolerated well. D/C home walking awake and alert. In no distress

## 2018-10-01 ENCOUNTER — Other Ambulatory Visit: Payer: Self-pay | Admitting: *Deleted

## 2018-10-23 NOTE — Telephone Encounter (Signed)
Virtual Visit Pre-Appointment Phone Call  "(Name), I am calling you today to discuss your upcoming appointment. We are currently trying to limit exposure to the virus that causes COVID-19 by seeing patients at home rather than in the office."  1. "What is the BEST phone number to call the day of the visit?" - include this in appointment notes  2. Do you have or have access to (through a family member/friend) a smartphone with video capability that we can use for your visit?" a. If yes - list this number in appt notes as cell (if different from BEST phone #) and list the appointment type as a VIDEO visit in appointment notes b. If no - list the appointment type as a PHONE visit in appointment notes  3. Confirm consent - "In the setting of the current Covid19 crisis, you are scheduled for a (phone or video) visit with your provider on (date) at (time).  Just as we do with many in-office visits, in order for you to participate in this visit, we must obtain consent.  If you'd like, I can send this to your mychart (if signed up) or email for you to review.  Otherwise, I can obtain your verbal consent now.  All virtual visits are billed to your insurance company just like a normal visit would be.  By agreeing to a virtual visit, we'd like you to understand that the technology does not allow for your provider to perform an examination, and thus may limit your provider's ability to fully assess your condition. If your provider identifies any concerns that need to be evaluated in person, we will make arrangements to do so.  Finally, though the technology is pretty good, we cannot assure that it will always work on either your or our end, and in the setting of a video visit, we may have to convert it to a phone-only visit.  In either situation, we cannot ensure that we have a secure connection.  Are you willing to proceed?" STAFF: Did the patient verbally acknowledge consent to telehealth visit? Document  YES/NO here: YES  4. Advise patient to be prepared - "Two hours prior to your appointment, go ahead and check your blood pressure, pulse, oxygen saturation, and your weight (if you have the equipment to check those) and write them all down. When your visit starts, your provider will ask you for this information. If you have an Apple Watch or Kardia device, please plan to have heart rate information ready on the day of your appointment. Please have a pen and paper handy nearby the day of the visit as well."  5. Give patient instructions for MyChart download to smartphone OR Doximity/Doxy.me as below if video visit (depending on what platform provider is using)  6. Inform patient they will receive a phone call 15 minutes prior to their appointment time (may be from unknown caller ID) so they should be prepared to answer    TELEPHONE CALL NOTE  Taylor Simmons has been deemed a candidate for a follow-up tele-health visit to limit community exposure during the Covid-19 pandemic. I spoke with the patient via phone to ensure availability of phone/video source, confirm preferred email & phone number, and discuss instructions and expectations.  I reminded Taylor Simmons to be prepared with any vital sign and/or heart rhythm information that could potentially be obtained via home monitoring, at the time of her visit. I reminded Taylor Simmons to expect a phone call prior to  her visit.  Clarisse Gouge 10/23/2018 12:52 PM   INSTRUCTIONS FOR DOWNLOADING THE MYCHART APP TO SMARTPHONE  - The patient must first make sure to have activated MyChart and know their login information - If Apple, go to [employer] and type in MyChart in the search bar and download the app. If Android, ask patient to go to [employer] and type in [place] in the search bar and download the app. The app is free but as with any other app downloads, their phone may require them to verify saved payment  information or Apple/Android password.  - The patient will need to then log into the app with their MyChart username and password, and select [facility] as their healthcare provider to link the account. When it is time for your visit, go to the MyChart app, find appointments, and click Begin Video Visit. Be sure to Select Allow for your device to access the Microphone and Camera for your visit. You will then be connected, and your provider will be with you shortly.  **If they have any issues connecting, or need assistance please contact MyChart service desk [phone])**  **If using a computer, in order to ensure the best quality for their visit they will need to use either of the following Internet Browsers: [employer], or Google Chrome**  IF USING DOXIMITY or DOXY.ME - The patient will receive a link just prior to their visit by text.     FULL LENGTH CONSENT FOR TELE-HEALTH VISIT   I hereby voluntarily request, consent and authorize [facility] and its employed or contracted physicians, physician assistants, nurse practitioners or other licensed health care professionals (the Practitioner), to provide me with telemedicine health care services (the Services") as deemed necessary by the treating Practitioner. I acknowledge and consent to receive the Services by the Practitioner via telemedicine. I understand that the telemedicine visit will involve communicating with the Practitioner through live audiovisual communication technology and the disclosure of certain medical information by electronic transmission. I acknowledge that I have been given the opportunity to request an in-person assessment or other available alternative prior to the telemedicine visit and am voluntarily participating in the telemedicine visit.  I understand that I have the right to withhold or withdraw my consent to the use of telemedicine in the course of my care at any time, without affecting my right  to future care or treatment, and that the Practitioner or I may terminate the telemedicine visit at any time. I understand that I have the right to inspect all information obtained and/or recorded in the course of the telemedicine visit and may receive copies of available information for a reasonable fee.  I understand that some of the potential risks of receiving the Services via telemedicine include:   Delay or interruption in medical evaluation due to technological equipment failure or disruption;  Information transmitted may not be sufficient (e.g. poor resolution of images) to allow for appropriate medical decision making by the Practitioner; and/or   In rare instances, security protocols could fail, causing a breach of personal health information.  Furthermore, I acknowledge that it is my responsibility to provide information about my medical history, conditions and care that is complete and accurate to the best of my ability. I acknowledge that Practitioner's advice, recommendations, and/or decision may be based on factors not within their control, such as incomplete or inaccurate data provided by me or distortions of diagnostic images or specimens that may result from electronic transmissions. I  understand that the practice of medicine is not an exact science and that Practitioner makes no warranties or guarantees regarding treatment outcomes. I acknowledge that I will receive a copy of this consent concurrently upon execution via email to the email address I last provided but may also request a printed copy by calling the office of [facility].    I understand that my insurance will be billed for this visit.   I have read or had this consent read to me.  I understand the contents of this consent, which adequately explains the benefits and risks of the Services being provided via telemedicine.   I have been provided ample opportunity to ask questions regarding this consent and the Services  and have had my questions answered to my satisfaction.  I give my informed consent for the services to be provided through the use of telemedicine in my medical care  By participating in this telemedicine visit I agree to the above.

## 2019-01-23 ENCOUNTER — Encounter

## 2019-02-11 ENCOUNTER — Other Ambulatory Visit: Payer: Self-pay

## 2019-02-11 DIAGNOSIS — Z20822 Contact with and (suspected) exposure to covid-19: Secondary | ICD-10-CM

## 2019-02-13 LAB — NOVEL CORONAVIRUS, NAA: SARS-CoV-2, NAA: NOT DETECTED

## 2019-06-19 ENCOUNTER — Telehealth: Payer: Self-pay | Admitting: *Deleted

## 2019-06-19 NOTE — Telephone Encounter (Signed)
(  06/19/2019) Left message for patient to notify them that it is time to schedule annual low dose lung cancer screening CT scan. Instructed patient to call back to verify information prior to the scan being scheduled SRW

## 2019-06-22 ENCOUNTER — Ambulatory Visit: Payer: Medicare Other | Attending: Internal Medicine

## 2019-06-22 DIAGNOSIS — Z23 Encounter for immunization: Secondary | ICD-10-CM | POA: Insufficient documentation

## 2019-06-22 NOTE — Progress Notes (Signed)
   Covid-19 Vaccination Clinic  Name:  Taylor Simmons    MRN: [id number] DOB: Jul 22, 1945  06/22/2019  [name] was observed post Covid-19 immunization for 15 minutes without incidence. She was provided with Vaccine Information Sheet and instruction to access the V-Safe system.   [name] was instructed to call 911 with any severe reactions post vaccine: Marland Kitchen Difficulty breathing  . Swelling of your face and throat  . A fast heartbeat  . A bad rash all over your body  . Dizziness and weakness    Immunizations Administered    Name Date Dose VIS Date Route   Pfizer COVID-19 Vaccine 06/22/2019  9:05 AM 0.3 mL 04/11/2019 Intramuscular   Manufacturer: [place]   Lot: [id number]   [place]: [id number]

## 2019-07-11 ENCOUNTER — Telehealth: Payer: Self-pay

## 2019-07-11 DIAGNOSIS — Z87891 Personal history of nicotine dependence: Secondary | ICD-10-CM

## 2019-07-11 NOTE — Telephone Encounter (Addendum)
Message left notifying patient that it is time to schedule annual low dose lung cancer screening CT scan. Instructed patient to call back at [phone] to verify information prior to the scan being scheduled.

## 2019-07-15 ENCOUNTER — Ambulatory Visit: Payer: Medicare Other | Attending: Internal Medicine

## 2019-07-15 DIAGNOSIS — Z23 Encounter for immunization: Secondary | ICD-10-CM

## 2019-07-15 NOTE — Progress Notes (Addendum)
   Covid-19 Vaccination Clinic  Name:  Taylor Simmons    MRN: [id number] DOB: November 03, 1945  07/15/2019  Taylor Simmons was observed post Covid-19 immunization for 15 minutes without incident. She was provided with Vaccine Information Sheet and instruction to access the V-Safe system.   Taylor Simmons was instructed to call 911 with any severe reactions post vaccine: Marland Kitchen Difficulty breathing  . Swelling of face and throat  . A fast heartbeat  . A bad rash all over body  . Dizziness and weakness   Immunizations Administered    Name Date Dose VIS Date Route   Pfizer COVID-19 Vaccine 07/15/2019  8:41 AM 0.3 mL 04/11/2019 Intramuscular   Manufacturer: [place]   Lot: [id number]   [place]: [id number]

## 2019-07-18 NOTE — Telephone Encounter (Signed)
Patient has been notified that annual lung cancer screening low dose CT scan is due currently or will be in near future. Confirmed that patient is within the age range of 55-77, and asymptomatic, (no signs or symptoms of lung cancer). Patient denies illness that would prevent curative treatment for lung cancer if found. Verified smoking history, (former, quti 08/08/07, 55 pack year). The shared decision making visit was done 11/28/16. Patient is agreeable for CT scan being scheduled.

## 2019-07-18 NOTE — Addendum Note (Signed)
Addended by: Lieutenant Diego on: 07/18/2019 09:28 AM   Modules accepted: Orders

## 2019-07-22 ENCOUNTER — Other Ambulatory Visit: Payer: Self-pay

## 2019-07-22 ENCOUNTER — Ambulatory Visit
Admission: RE | Admit: 2019-07-22 | Discharge: 2019-07-22 | Disposition: A | Discharge status: Home / Self Care | Payer: Medicare Other | Source: Ambulatory Visit | Attending: Nurse Practitioner | Admitting: Nurse Practitioner

## 2019-07-22 DIAGNOSIS — Z87891 Personal history of nicotine dependence: Secondary | ICD-10-CM | POA: Insufficient documentation

## 2019-07-25 ENCOUNTER — Encounter: Payer: Self-pay | Admitting: *Deleted

## 2019-07-31 ENCOUNTER — Ambulatory Visit (INDEPENDENT_AMBULATORY_CARE_PROVIDER_SITE_OTHER): Payer: Medicare Other | Admitting: Dermatology

## 2019-07-31 ENCOUNTER — Other Ambulatory Visit: Payer: Self-pay

## 2019-07-31 DIAGNOSIS — Z86018 Personal history of other benign neoplasm: Secondary | ICD-10-CM

## 2019-07-31 DIAGNOSIS — Z85828 Personal history of other malignant neoplasm of skin: Secondary | ICD-10-CM

## 2019-07-31 DIAGNOSIS — Z1283 Encounter for screening for malignant neoplasm of skin: Secondary | ICD-10-CM

## 2019-07-31 DIAGNOSIS — L578 Other skin changes due to chronic exposure to nonionizing radiation: Secondary | ICD-10-CM | POA: Diagnosis not present

## 2019-07-31 DIAGNOSIS — L719 Rosacea, unspecified: Secondary | ICD-10-CM

## 2019-07-31 DIAGNOSIS — L905 Scar conditions and fibrosis of skin: Secondary | ICD-10-CM

## 2019-07-31 DIAGNOSIS — I8393 Asymptomatic varicose veins of bilateral lower extremities: Secondary | ICD-10-CM | POA: Diagnosis not present

## 2019-07-31 DIAGNOSIS — D229 Melanocytic nevi, unspecified: Secondary | ICD-10-CM

## 2019-07-31 DIAGNOSIS — L821 Other seborrheic keratosis: Secondary | ICD-10-CM

## 2019-07-31 DIAGNOSIS — L814 Other melanin hyperpigmentation: Secondary | ICD-10-CM

## 2019-07-31 DIAGNOSIS — I8390 Asymptomatic varicose veins of unspecified lower extremity: Secondary | ICD-10-CM

## 2019-07-31 MED ORDER — METRONIDAZOLE 0.75 % EX GEL: 1.0000 "application " | Freq: Two times a day (BID) | CUTANEOUS | 2 refills | Status: AC

## 2019-07-31 NOTE — Progress Notes (Signed)
   Follow-Up Visit   Subjective  Taylor Simmons is a 74 y.o. female who presents for the following: Annual Exam (1 year f/u TBSE, hx of BCC, hx of Dysplastic nevus ). The patient presents for skin cancer screening.  She has several areas to be evaluated.  She is not certain sure if some have changed.  The following portions of the chart were reviewed this encounter and updated as appropriate:     Review of Systems: No other skin or systemic complaints.  Objective  Well appearing patient in no apparent distress; mood and affect are within normal limits.  A full examination was performed including scalp, head, eyes, ears, nose, lips, neck, chest, axillae, abdomen, back, buttocks, bilateral upper extremities, bilateral lower extremities, hands, feet, fingers, toes, fingernails, and toenails. All findings within normal limits unless otherwise noted below.  Objective  lower legs: Varicose veins, no symptons  Assessment & Plan   Skin cancer screening performed today. Actinic Damage - diffuse scaly erythematous macules with underlying dyspigmentation - Recommend daily broad spectrum sunscreen SPF 30+ to sun-exposed areas, reapply every 2 hours as needed.  - Call for new or changing lesions. Lentigines - Scattered tan macules - Discussed due to sun exposure - Benign, observe - Call for any changes Melanocytic Nevi - Tan-brown and/or pink-flesh-colored symmetric macules and papules - Benign appearing on exam today - Observation - Call clinic for new or changing moles - Recommend daily use of broad spectrum spf 30+ sunscreen to sun-exposed areas.  Seborrheic Keratoses - Stuck-on, waxy, tan-brown papules and plaques  - Discussed benign etiology and prognosis. - Observe - Call for any changes  . Asymptomatic varicose veins lower legs  Observe   History of basal cell carcinoma (BCC) Nose  History of dysplastic nevus Chest - Medial (Center)  Rosacea Head - Anterior  (Face)  Acne Rosacea exacerbated by the mask   metroNIDAZOLE (METROGEL) 0.75 % gel - Head - Anterior (Face)  Scar Left Breast  Return in about 1 year (around 07/30/2020).

## 2019-08-20 ENCOUNTER — Other Ambulatory Visit: Payer: Self-pay

## 2019-08-20 ENCOUNTER — Ambulatory Visit (INDEPENDENT_AMBULATORY_CARE_PROVIDER_SITE_OTHER): Payer: Medicare Other | Admitting: Dermatology

## 2019-08-20 DIAGNOSIS — L820 Inflamed seborrheic keratosis: Secondary | ICD-10-CM

## 2019-08-20 DIAGNOSIS — L821 Other seborrheic keratosis: Secondary | ICD-10-CM | POA: Diagnosis not present

## 2019-08-20 NOTE — Progress Notes (Addendum)
   Follow-Up Visit   Subjective  Taylor Simmons is a 74 y.o. female who presents for the following: Skin Tag (skin tags beneath breast itching, irritating, growing, pt would like to have skin tags removed today ).    The following portions of the chart were reviewed this encounter and updated as appropriate:  Tobacco  Allergies  Meds  Problems  Med Hx  Surg Hx  Fam Hx      Review of Systems:  No other skin or systemic complaints except as noted in HPI or Assessment and Plan.  Objective  Well appearing patient in no apparent distress; mood and affect are within normal limits.  A focused examination was performed including chest, inframammary . Relevant physical exam findings are noted in the Assessment and Plan.  Objective  chest, inframammary (19): Erythematous keratotic or waxy stuck-on papule or plaque.    Assessment & Plan  Inflamed seborrheic keratosis (19) chest, inframammary  Destruction of lesion - chest, inframammary Complexity: simple   Destruction method: cryotherapy   Informed consent: discussed and consent obtained   Timeout:  patient name, date of birth, surgical site, and procedure verified Lesion destroyed using liquid nitrogen: Yes   Region frozen until ice ball extended beyond lesion: Yes   Outcome: patient tolerated procedure well with no complications   Post-procedure details: wound care instructions given    Seborrheic Keratoses - Stuck-on, waxy, tan-brown papules and plaques  - Discussed benign etiology and prognosis. - Observe - Call for any changes  Return for as scheduled .  IMarye Round, CMA, am acting as scribe for Sarina Ser, MD .  Documentation: I have reviewed the above documentation for accuracy and completeness, and I agree with the above.  Sarina Ser, MD

## 2019-08-21 ENCOUNTER — Encounter: Payer: Self-pay | Admitting: Dermatology

## 2020-07-08 ENCOUNTER — Telehealth: Payer: Self-pay

## 2020-07-08 NOTE — Telephone Encounter (Signed)
Called pt to let her know she is due to her annual CT lung screening. Patient prefers to call back when she is back in town and can look at when her doctors appointment is in April to coordinate her schedule. Left Taylor Simmons number [phone]) for her to call back and schedule at her convenience.

## 2020-07-12 ENCOUNTER — Other Ambulatory Visit: Payer: Self-pay | Admitting: *Deleted

## 2020-07-12 DIAGNOSIS — Z87891 Personal history of nicotine dependence: Secondary | ICD-10-CM

## 2020-07-12 DIAGNOSIS — Z122 Encounter for screening for malignant neoplasm of respiratory organs: Secondary | ICD-10-CM

## 2020-07-12 NOTE — Progress Notes (Signed)
Contacted and will be scheduled for annual lung screening scan. Patient is a former smoker, quit 08/08/07, 55 pack year history.

## 2020-08-02 ENCOUNTER — Ambulatory Visit
Admission: RE | Admit: 2020-08-02 | Discharge: 2020-08-02 | Disposition: A | Discharge status: Home / Self Care | Payer: Medicare Other | Source: Ambulatory Visit | Attending: Oncology | Admitting: Oncology

## 2020-08-02 ENCOUNTER — Other Ambulatory Visit: Payer: Self-pay

## 2020-08-02 ENCOUNTER — Encounter: Payer: Self-pay | Admitting: Dermatology

## 2020-08-02 ENCOUNTER — Ambulatory Visit (INDEPENDENT_AMBULATORY_CARE_PROVIDER_SITE_OTHER): Payer: Medicare Other | Admitting: Dermatology

## 2020-08-02 DIAGNOSIS — L578 Other skin changes due to chronic exposure to nonionizing radiation: Secondary | ICD-10-CM

## 2020-08-02 DIAGNOSIS — Z87891 Personal history of nicotine dependence: Secondary | ICD-10-CM | POA: Insufficient documentation

## 2020-08-02 DIAGNOSIS — L918 Other hypertrophic disorders of the skin: Secondary | ICD-10-CM

## 2020-08-02 DIAGNOSIS — L821 Other seborrheic keratosis: Secondary | ICD-10-CM

## 2020-08-02 DIAGNOSIS — L814 Other melanin hyperpigmentation: Secondary | ICD-10-CM

## 2020-08-02 DIAGNOSIS — L719 Rosacea, unspecified: Secondary | ICD-10-CM

## 2020-08-02 DIAGNOSIS — Z86018 Personal history of other benign neoplasm: Secondary | ICD-10-CM

## 2020-08-02 DIAGNOSIS — D229 Melanocytic nevi, unspecified: Secondary | ICD-10-CM

## 2020-08-02 DIAGNOSIS — Z122 Encounter for screening for malignant neoplasm of respiratory organs: Secondary | ICD-10-CM | POA: Diagnosis present

## 2020-08-02 DIAGNOSIS — D1800 Hemangioma unspecified site: Secondary | ICD-10-CM

## 2020-08-02 DIAGNOSIS — Z85828 Personal history of other malignant neoplasm of skin: Secondary | ICD-10-CM | POA: Diagnosis not present

## 2020-08-02 DIAGNOSIS — Z1283 Encounter for screening for malignant neoplasm of skin: Secondary | ICD-10-CM | POA: Diagnosis not present

## 2020-08-02 DIAGNOSIS — L905 Scar conditions and fibrosis of skin: Secondary | ICD-10-CM

## 2020-08-02 MED ORDER — METRONIDAZOLE 0.75 % EX GEL: 1.0000 "application " | CUTANEOUS | 11 refills | Status: AC

## 2020-08-02 NOTE — Progress Notes (Signed)
Follow-Up Visit   Subjective  Taylor Simmons is a 75 y.o. female who presents for the following: tbse (Patient here today for tbse. She has history of isk, rosacea, and bcc on right nose. She reports no new areas of concern today. ). Patient here for full body skin exam and skin cancer screening.  The following portions of the chart were reviewed this encounter and updated as appropriate:  Tobacco  Allergies  Meds  Problems  Med Hx  Surg Hx  Fam Hx      Objective  Well appearing patient in no apparent distress; mood and affect are within normal limits.  A full examination was performed including scalp, head, eyes, ears, nose, lips, neck, chest, axillae, abdomen, back, buttocks, bilateral upper extremities, bilateral lower extremities, hands, feet, fingers, toes, fingernails, and toenails. All findings within normal limits unless otherwise noted below.  Objective  face: Mid face erythema with telangiectasias +/- scattered inflammatory papules.   Objective  left posterior neck x 1, right posterior neck x 1 (2): Fleshy, skin-colored pedunculated papules.    Objective  Left Breast: Scar   Assessment & Plan  Rosacea face Mild rosacea  Patient reports she is occasionally gets pimple on sides of face  Rosacea is a chronic progressive skin condition usually affecting the face of adults, causing redness and/or acne bumps. It is treatable but not curable. It sometimes affects the eyes (ocular rosacea) as well. It may respond to topical and/or systemic medication and can flare with stress, sun exposure, alcohol, exercise and some foods.  Daily application of broad spectrum spf 30+ sunscreen to face is recommended to reduce flares.  Continue using metroNIDAZOLE (METROGEL) 0.75 % gel - Head - Anterior (Face) refills 45 g  and 11 refills  metroNIDAZOLE (METROGEL) 0.75 % gel - face  Acrochordon (2) left posterior neck x 1, right posterior neck x 1 Irritated skin tags x  2 Procedure: Skin tag removal Informed consent:  Discussed risks (permanent scarring, infection, pain, bleeding, bruising, redness, and recurrence of the lesion) and benefits of the procedure, as well as the alternatives.  Patient is aware that skin tags are benign lesions, and their removal is often not considered medically necessary.  Informed consent was obtained. The area was prepared with isopropyl alcohol. Anesthesia:  lidocaine 1% with epinephrine was injected to achieve good local anesthesia Snip removal was performed.   Antibiotic ointment and a sterile dressing were applied.   The patient tolerated procedure well. The patient was instructed on post-op care.   Number of lesions removed:  2  Scar Left Breast Benign, observe.    Lentigines - Scattered tan macules - Due to sun exposure - Benign-appering, observe - Recommend daily broad spectrum sunscreen SPF 30+ to sun-exposed areas, reapply every 2 hours as needed. - Call for any changes  Seborrheic Keratoses - Stuck-on, waxy, tan-brown papules and/or plaques  - Benign-appearing - Discussed benign etiology and prognosis. - Observe - Call for any changes  Melanocytic Nevi - Tan-brown and/or pink-flesh-colored symmetric macules and papules - Benign appearing on exam today - Observation - Call clinic for new or changing moles - Recommend daily use of broad spectrum spf 30+ sunscreen to sun-exposed areas.   Hemangiomas - Red papules - Discussed benign nature - Observe - Call for any changes  Actinic Damage - Chronic condition, secondary to cumulative UV/sun exposure - diffuse scaly erythematous macules with underlying dyspigmentation - Recommend daily broad spectrum sunscreen SPF 30+ to sun-exposed areas, reapply every 2  hours as needed. tbs - Staying in the shade or wearing long sleeves, sun glasses (UVA+UVB protection) and wide brim hats (4-inch brim around the entire circumference of the hat) are also recommended  for sun protection.  - Call for new or changing lesions.  History of Basal Cell Carcinoma of the Skin - No evidence of recurrence today right nose  - Recommend regular full body skin exams - Recommend daily broad spectrum sunscreen SPF 30+ to sun-exposed areas, reapply every 2 hours as needed.  - Call if any new or changing lesions are noted between office visits  History of Dysplastic Nevi - No evidence of recurrence today on medial chest  - Recommend regular full body skin exams - Recommend daily broad spectrum sunscreen SPF 30+ to sun-exposed areas, reapply every 2 hours as needed.  - Call if any new or changing lesions are noted between office visits  Skin cancer screening performed today.  Return in about 1 year (around 08/02/2021) for tbse.  IRuthell Rummage, CMA, am acting as scribe for Sarina Ser, MD.  Documentation: I have reviewed the above documentation for accuracy and completeness, and I agree with the above.  Sarina Ser, MD

## 2020-08-02 NOTE — Patient Instructions (Addendum)
Skin Tag Removal Wound Care Instructions  . Sometimes bandages are not necessary.  If a bandage is used, leave the original bandage on for 24 hours if possible.  If the bandage becomes soaked or soiled before that time, it is OK to remove it and examine the wound.  A small amount of post-operative bleeding is normal.  If excessive bleeding occurs, remove the bandage, place gauze over the site and apply continuous pressure (no peeking) over the area for 20-30 minutes.  If this does not stop the bleeding, try again for 40 minutes.  If this does not work, please call our clinic as soon as possible (even if after hours).    . Once a day, cleanse the wound with soap and water.  If a thick crust develops you may use a Q-tip dipped into dilute hydrogen peroxide (mix 1:1 with water) to dissolve it.  Hydrogen peroxide can slow the healing process, so use it only as needed.  After washing, apply Vaseline jelly or Polysporin ointment.  For best healing, the wound should be covered with a layer of ointment at all times.  This may mean re-applying the ointment several times a day.  For open wounds, continue until it has healed.    . If you have any swelling, keep the area elevated.  . Some redness, tenderness and white or yellow material in the wound is normal healing.  If the area becomes very sore and red, or develops a thick yellow-green material (pus), it may be infected; please notify us.    . Wound healing continues for up to one year following surgery.  It is not unusual to experience pain in the scar from time to time during the interval.  If the pain becomes severe or the scar thickens, you should notify the office.  A slight amount of redness in a scar is expected for the first six months.  After six months, the redness subsides and the scar will soften and fade.  The color difference becomes less noticeable with time.  If there are any problems, return for a post-op surgery check at your earliest  convenience.  . It is important to wear sunscreen daily with an SPF of 30 or higher over the treated sites and to wear sun-protective clothing.  . Please call our office at [phone] for any questions or concerns.    Melanoma ABCDEs  Melanoma is the most dangerous type of skin cancer, and is the leading cause of death from skin disease.  You are more likely to develop melanoma if you:  Have light-colored skin, light-colored eyes, or red or blond hair  Spend a lot of time in the sun  Tan regularly, either outdoors or in a tanning bed  Have had blistering sunburns, especially during childhood  Have a close family member who has had a melanoma  Have atypical moles or large birthmarks  Early detection of melanoma is key since treatment is typically straightforward and cure rates are extremely high if we catch it early.   The first sign of melanoma is often a change in a mole or a new dark spot.  The ABCDE system is a way of remembering the signs of melanoma.  A for asymmetry:  The two halves do not match. B for border:  The edges of the growth are irregular. C for color:  A mixture of colors are present instead of an even brown color. D for diameter:  Melanomas are usually (but not always) greater  than 94mm - the size of a pencil eraser. E for evolution:  The spot keeps changing in size, shape, and color.  Please check your skin once per month between visits. You can use a small mirror in front and a large mirror behind you to keep an eye on the back side or your body.   If you see any new or changing lesions before your next follow-up, please call to schedule a visit.  Please continue daily skin protection including broad spectrum sunscreen SPF 30+ to sun-exposed areas, reapplying every 2 hours as needed when you're outdoors.   Staying in the shade or wearing long sleeves, sun glasses (UVA+UVB protection) and wide brim hats (4-inch brim around the entire circumference of the  hat) are also recommended for sun protection.   If you have any questions or concerns for your doctor, please call our main line at [phone] and press option 4 to reach your doctor's medical assistant. If no one answers, please leave a voicemail as directed and we will return your call as soon as possible. Messages left after 4 pm will be answered the following business day.   You may also send [place] a message via [place]. We typically respond to MyChart messages within 1-2 business days.  For prescription refills, please ask your pharmacy to contact our office. Our fax number is [phone].  If you have an urgent issue when the clinic is closed that cannot wait until the next business day, you can page your doctor at the number below.    Please note that while we do our best to be available for urgent issues outside of office hours, we are not available 24/7.   If you have an urgent issue and are unable to reach [place], you may choose to seek medical care at your doctor's office, retail clinic, urgent care center, or emergency room.  If you have a medical emergency, please immediately call 911 or go to the emergency department.  Pager Numbers  - Dr. Nehemiah Massed: [phone]  - Dr. Laurence Ferrari: [phone]  - Dr. Nicole Kindred: [phone]  In the event of inclement weather, please call our main line at [phone] for an update on the status of any delays or closures.  Dermatology Medication Tips: Please keep the boxes that topical medications come in in order to help keep track of the instructions about where and how to use these. Pharmacies typically print the medication instructions only on the boxes and not directly on the medication tubes.   If your medication is too expensive, please contact our office at [phone] option 4 or send [place] a message through [place].   We are unable to tell what your co-pay for medications will be in advance as this is different depending on your insurance  coverage. However, we may be able to find a substitute medication at lower cost or fill out paperwork to get insurance to cover a needed medication.   If a prior authorization is required to get your medication covered by your insurance company, please allow [place] 1-2 business days to complete this process.  Drug prices often vary depending on where the prescription is filled and some pharmacies may offer cheaper prices.  The website www.goodrx.com contains coupons for medications through different pharmacies. The prices here do not account for what the cost may be with help from insurance (it may be cheaper with your insurance), but the website can give you the price if you did not use any insurance.  - You  can print the associated coupon and take it with your prescription to the pharmacy.  - You may also stop by our office during regular business hours and pick up a GoodRx coupon card.  - If you need your prescription sent electronically to a different pharmacy, notify our office through [facility] MyChart or by phone at [phone] option 4.  

## 2020-08-10 ENCOUNTER — Encounter: Payer: Self-pay | Admitting: *Deleted

## 2021-07-23 IMAGING — CT CT CHEST LUNG CANCER SCREENING LOW DOSE W/O CM
2 of 5 series · 15 of 40 positions shown, 18 images · non-contrast
Comparison: Low-dose lung cancer screening CT chest dated
06/19/2018

CLINICAL DATA: 73-year-old female former smoker, quit 12 years ago,
with 55 pack-year history of smoking, for follow-up lung cancer
screening

EXAM:
CT CHEST WITHOUT CONTRAST LOW-DOSE FOR LUNG CANCER SCREENING
TECHNIQUE: Multidetector CT imaging of the chest was performed following the
standard protocol without IV contrast.

[Series 3: lung 1.00 · axial · 0.69mm/px · z∈[-1213,-942]mm · 12 of 301 slices shown, 15 images]
[im 15/301  mediastinal]
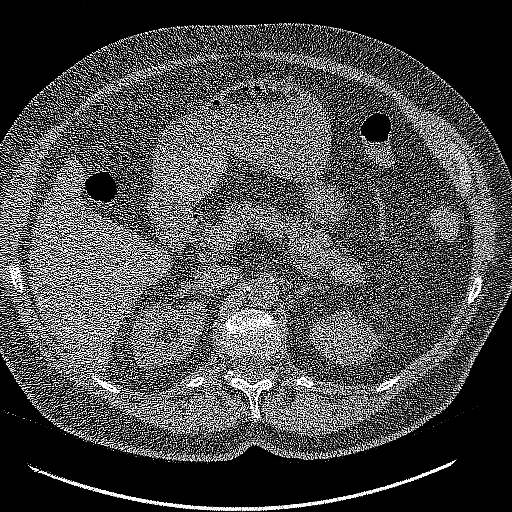
[im 15/301  lung]
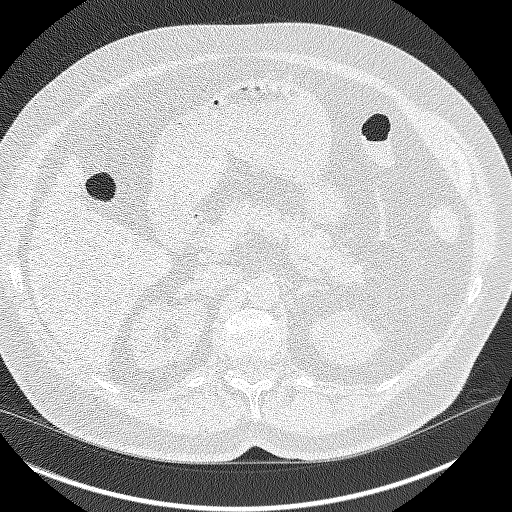
[im 43/301  lung]
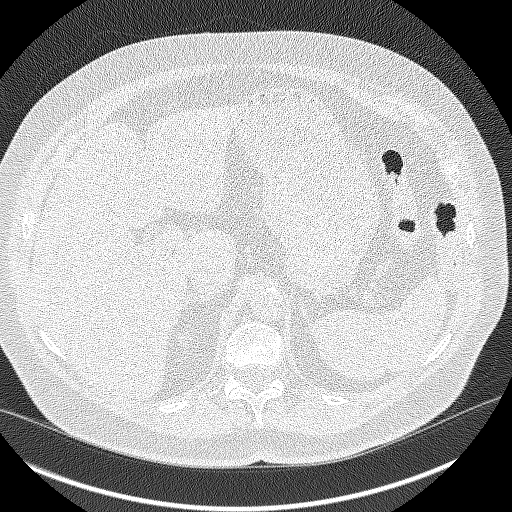
[im 72/301  lung]
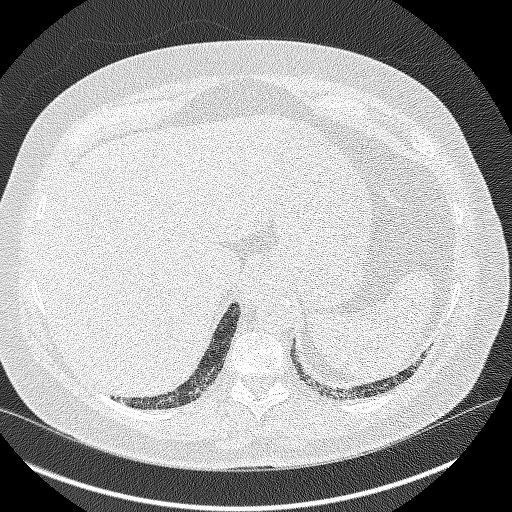
[im 86/301  lung]
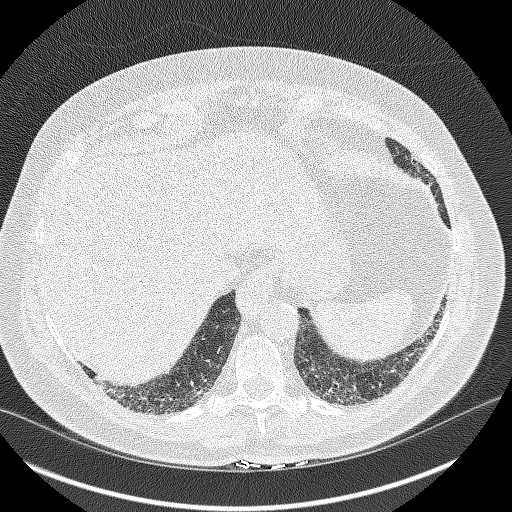
[im 115/301  mediastinal]
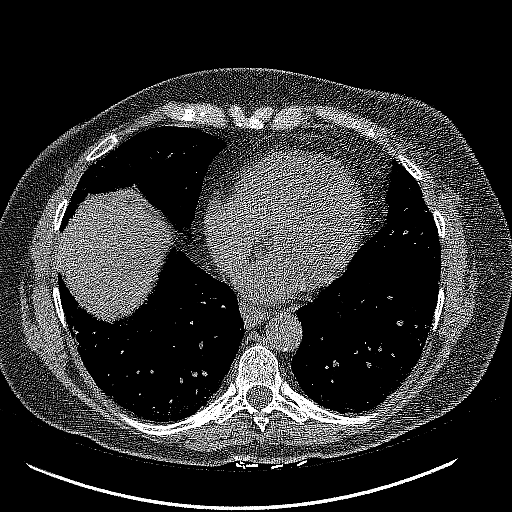
[im 115/301  lung]
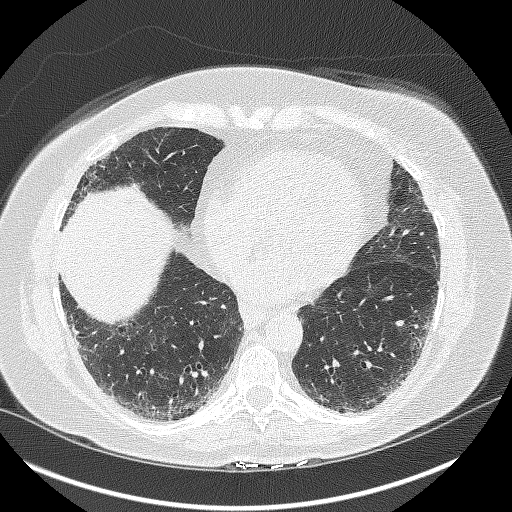
[im 143/301  lung]
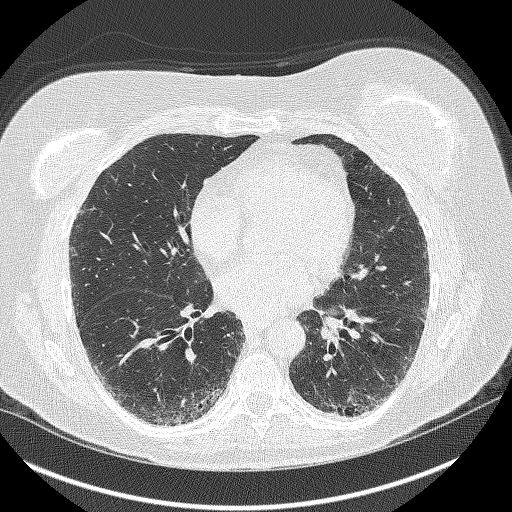
[im 158/301  lung]
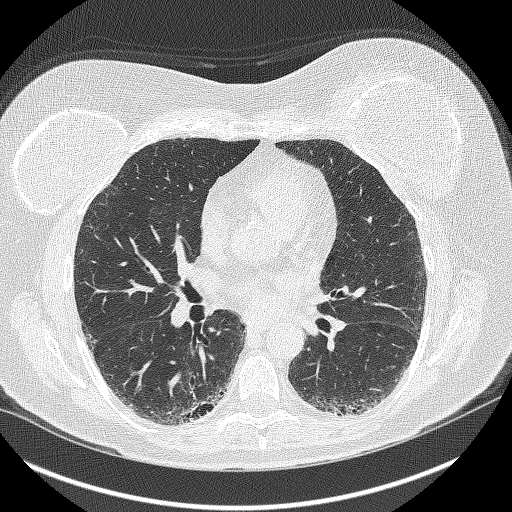
[im 186/301  lung]
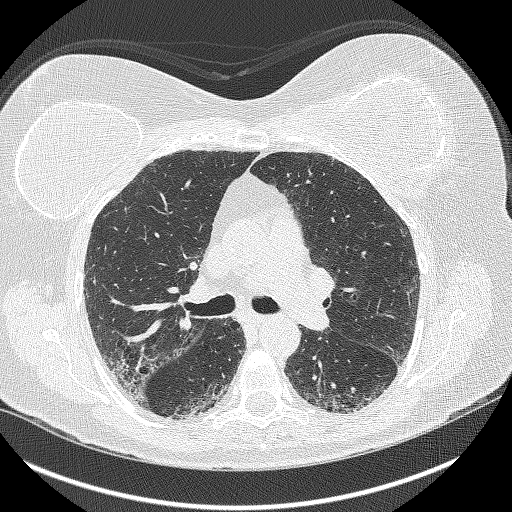
[im 215/301  mediastinal]
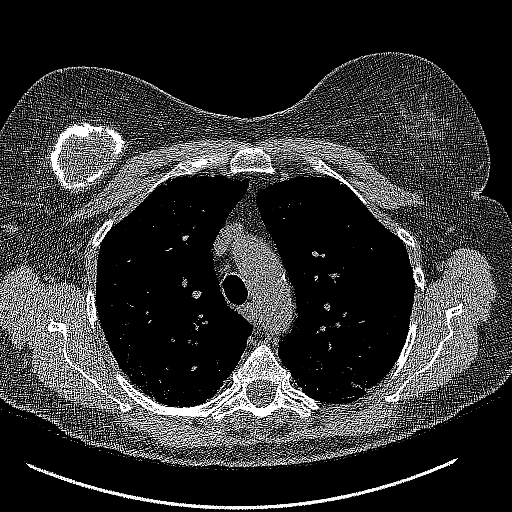
[im 215/301  lung]
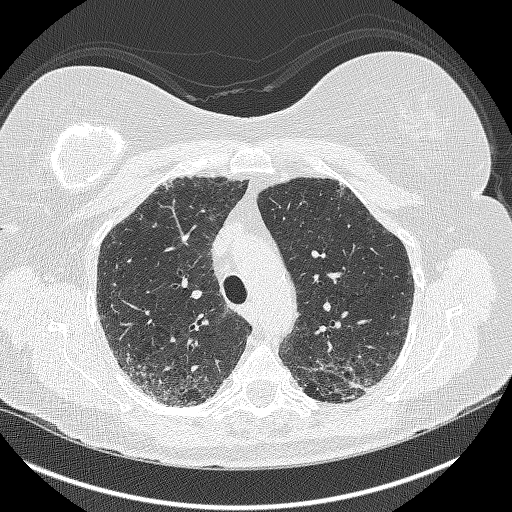
[im 229/301  lung]
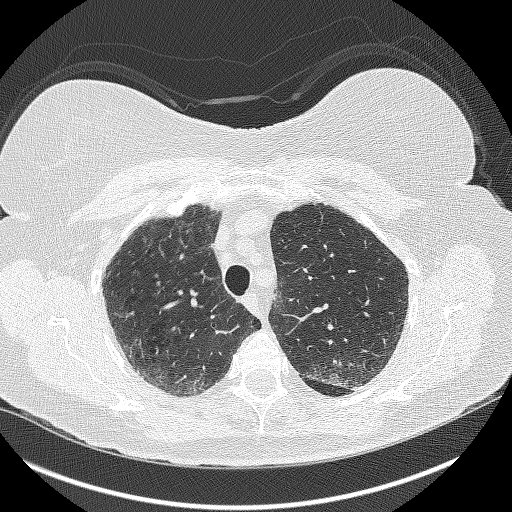
[im 258/301  lung]
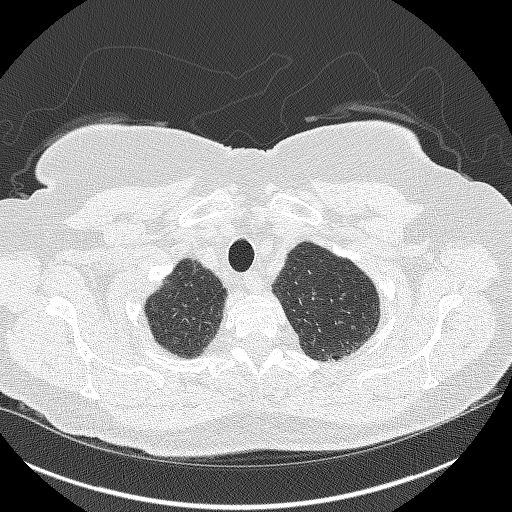
[im 286/301  lung]
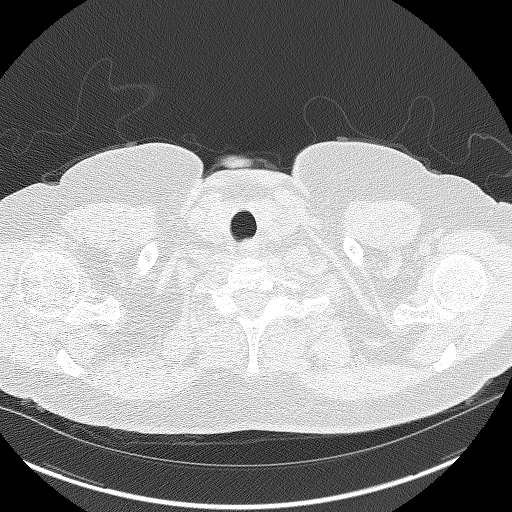

[Series 4: coronals lung 1.00 cor · coronal · 0.59mm/px · 3 of 322 slices shown]
[im 65/322  lung]
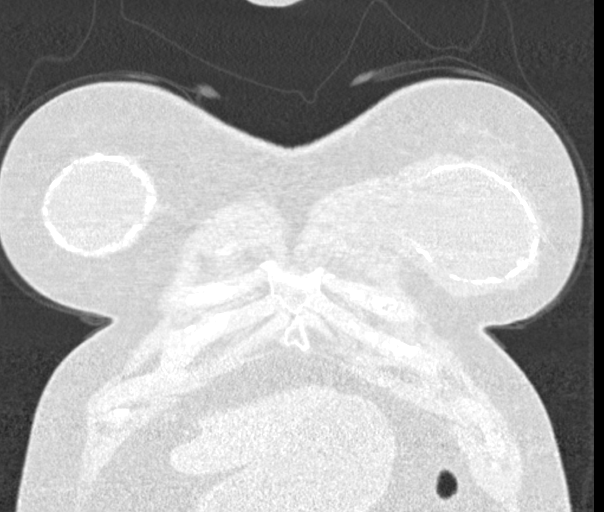
[im 129/322  lung]
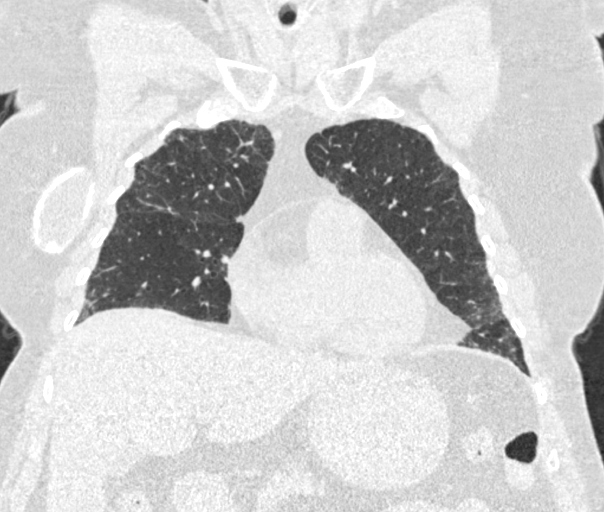
[im 193/322  lung]
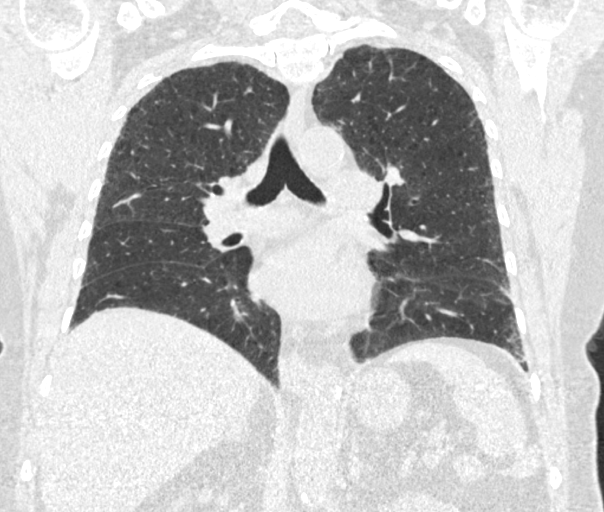

[15 of 40 positions shown; findings below may reference images not displayed]

FINDINGS: Cardiovascular: The heart is normal in size. No pericardial
effusion.

No evidence of thoracic aortic aneurysm. Atherosclerotic
calcifications of the aortic arch.

Mild atherosclerosis of the LAD.

Mediastinum/Nodes: No suspicious mediastinal lymphadenopathy.

Visualized thyroid is unremarkable.

Lungs/Pleura: Mild centrilobular and paraseptal emphysematous
changes, upper lung predominant.

Subpleural reticulation/fibrosis in the lungs bilaterally, basilar
predominant. This appearance is unchanged from the prior.
Differential considerations include a UIP pattern or fibrotic NSIP.

No focal consolidation.

No pleural effusion or pneumothorax.

Upper Abdomen: Visualized upper abdomen is grossly unremarkable,
noting mild vascular calcifications.

Musculoskeletal: Bilateral breast augmentation. Mild degenerative
changes of the mid thoracic spine.
IMPRESSION: Lung-RADS 1, negative. Continue annual screening with low-dose chest
CT without contrast in 12 months.

Aortic Atherosclerosis (JUQE9-K1U.U) and Emphysema ([id number].R).

## 2021-08-08 ENCOUNTER — Encounter: Payer: Medicare Other | Admitting: Dermatology

## 2021-10-06 ENCOUNTER — Telehealth: Payer: Self-pay | Admitting: Acute Care

## 2021-10-06 NOTE — Telephone Encounter (Signed)
Called pt to schedule annual LDCT scan; [place] prohibitive.
